# Patient Record
Sex: Female | Born: 1960 | Hispanic: Yes | Smoking: Never smoker
Health system: Southern US, Community
[De-identification: ages and names within clinical notes are randomized; demographics above are authoritative.]

## PROBLEM LIST (undated history)

## (undated) DIAGNOSIS — G43909 Migraine, unspecified, not intractable, without status migrainosus: Secondary | ICD-10-CM

## (undated) DIAGNOSIS — E079 Disorder of thyroid, unspecified: Secondary | ICD-10-CM

## (undated) HISTORY — PX: APPENDECTOMY: SHX54

---

## 2013-12-13 ENCOUNTER — Encounter (HOSPITAL_COMMUNITY): Payer: Self-pay | Admitting: Emergency Medicine

## 2013-12-13 ENCOUNTER — Emergency Department (HOSPITAL_COMMUNITY): Payer: Self-pay

## 2013-12-13 ENCOUNTER — Emergency Department (HOSPITAL_COMMUNITY)
Admission: EM | Admit: 2013-12-13 | Discharge: 2013-12-13 | Disposition: A | Discharge status: Home / Self Care | Payer: Self-pay | Attending: Emergency Medicine | Admitting: Emergency Medicine

## 2013-12-13 DIAGNOSIS — K8020 Calculus of gallbladder without cholecystitis without obstruction: Secondary | ICD-10-CM | POA: Diagnosis present

## 2013-12-13 HISTORY — DX: Migraine, unspecified, not intractable, without status migrainosus: G43.909

## 2013-12-13 LAB — URINALYSIS, ROUTINE W REFLEX MICROSCOPIC
BILIRUBIN URINE: NEGATIVE
Glucose, UA: NEGATIVE mg/dL
KETONES UR: NEGATIVE mg/dL
Leukocytes, UA: NEGATIVE
NITRITE: NEGATIVE
Protein, ur: NEGATIVE mg/dL
Specific Gravity, Urine: 1.019 (ref 1.005–1.030)
UROBILINOGEN UA: 0.2 mg/dL (ref 0.0–1.0)
pH: 6 (ref 5.0–8.0)

## 2013-12-13 LAB — URINE MICROSCOPIC-ADD ON

## 2013-12-13 LAB — CBC WITH DIFFERENTIAL/PLATELET
Basophils Absolute: 0 10*3/uL (ref 0.0–0.1)
Basophils Relative: 0 % (ref 0–1)
EOS ABS: 0.1 10*3/uL (ref 0.0–0.7)
Eosinophils Relative: 1 % (ref 0–5)
HCT: 36.8 % (ref 36.0–46.0)
HEMOGLOBIN: 12.8 g/dL (ref 12.0–15.0)
LYMPHS PCT: 38 % (ref 12–46)
Lymphs Abs: 4 10*3/uL (ref 0.7–4.0)
MCH: 30.1 pg (ref 26.0–34.0)
MCHC: 34.8 g/dL (ref 30.0–36.0)
MCV: 86.6 fL (ref 78.0–100.0)
MONOS PCT: 7 % (ref 3–12)
Monocytes Absolute: 0.8 10*3/uL (ref 0.1–1.0)
NEUTROS PCT: 54 % (ref 43–77)
Neutro Abs: 5.8 10*3/uL (ref 1.7–7.7)
Platelets: 358 10*3/uL (ref 150–400)
RBC: 4.25 MIL/uL (ref 3.87–5.11)
RDW: 13.7 % (ref 11.5–15.5)
WBC: 10.8 10*3/uL — AB (ref 4.0–10.5)

## 2013-12-13 LAB — COMPREHENSIVE METABOLIC PANEL
ALK PHOS: 118 U/L — AB (ref 39–117)
ALT: 20 U/L (ref 0–35)
AST: 22 U/L (ref 0–37)
Albumin: 3.8 g/dL (ref 3.5–5.2)
BILIRUBIN TOTAL: 0.3 mg/dL (ref 0.3–1.2)
BUN: 14 mg/dL (ref 6–23)
CHLORIDE: 103 meq/L (ref 96–112)
CO2: 26 meq/L (ref 19–32)
Calcium: 9.8 mg/dL (ref 8.4–10.5)
Creatinine, Ser: 0.64 mg/dL (ref 0.50–1.10)
GLUCOSE: 103 mg/dL — AB (ref 70–99)
POTASSIUM: 4.1 meq/L (ref 3.7–5.3)
Sodium: 141 mEq/L (ref 137–147)
TOTAL PROTEIN: 7.9 g/dL (ref 6.0–8.3)

## 2013-12-13 LAB — LIPASE, BLOOD: LIPASE: 51 U/L (ref 11–59)

## 2013-12-13 MED ORDER — SODIUM CHLORIDE 0.9 % IV BOLUS (SEPSIS)
1000.0000 mL | INTRAVENOUS | Status: AC
  Administered 2013-12-13: 1000 mL via INTRAVENOUS

## 2013-12-13 MED ORDER — HYDROMORPHONE HCL PF 1 MG/ML IJ SOLN
1.0000 mg | INTRAMUSCULAR | Status: AC
  Administered 2013-12-13: 1 mg via INTRAVENOUS
  Filled 2013-12-13: qty 1

## 2013-12-13 MED ORDER — OXYCODONE-ACETAMINOPHEN 5-325 MG PO TABS: 1.0000 | ORAL_TABLET | Freq: Four times a day (QID) | ORAL | Status: DC | PRN

## 2013-12-13 MED ORDER — ONDANSETRON 4 MG PO TBDP: ORAL_TABLET | ORAL | Status: DC

## 2013-12-13 MED ORDER — ONDANSETRON HCL 4 MG/2ML IJ SOLN
4.0000 mg | Freq: Once | INTRAMUSCULAR | Status: AC
  Administered 2013-12-13: 4 mg via INTRAVENOUS
  Filled 2013-12-13: qty 2

## 2013-12-13 MED ORDER — OXYCODONE-ACETAMINOPHEN 5-325 MG PO TABS
1.0000 | ORAL_TABLET | Freq: Once | ORAL | Status: AC
  Administered 2013-12-13: 1 via ORAL
  Filled 2013-12-13: qty 1

## 2013-12-13 MED ORDER — HYDROMORPHONE HCL PF 1 MG/ML IJ SOLN
0.5000 mg | Freq: Once | INTRAMUSCULAR | Status: AC
  Administered 2013-12-13: 0.5 mg via INTRAVENOUS
  Filled 2013-12-13: qty 1

## 2013-12-13 MED ORDER — ONDANSETRON 4 MG PO TBDP
4.0000 mg | ORAL_TABLET | Freq: Once | ORAL | Status: AC
  Administered 2013-12-13: 4 mg via ORAL
  Filled 2013-12-13: qty 1

## 2013-12-13 NOTE — ED Notes (Signed)
Pt begin vomiting at discharge. Notified EDP. Gave pt Zofran per order. Doctor stated to keep pt. Informed pt to stay and we will follow up in 30 minutes.

## 2013-12-13 NOTE — Discharge Instructions (Signed)
Colelitiasis (Cholelithiasis) La colelitiasis (tambin llamada clculos en la vescula) es una enfermedad en la que se forman piedras en la vescula. La vescula es un rgano que almacena la bilis que se forma en el hgado y que ayuda a Engineer, agricultural. Los clculos comienzan como pequeos cristales y lentamente se transforman en piedras. El dolor en la vescula ocurre cuando se producen espasmos y los clculos obstruyen el conducto. El dolor tambin se produce cuando una piedra sale por el conducto.  FACTORES DE RIESGO  Ser mujer.   Tener embarazos mltiples. Algunas veces los mdicos aconsejan extirpar los clculos biliares antes de futuros embarazos.   Ser obeso.  Dietas que incluyan comidas fritas y grasas.   Ser mayor de 56 aos y el aumento de la edad.   El uso prolongado de medicamentos que contengan hormonas femeninas.   Tener diabetes mellitus.   Prdida rpida de peso.   Historia familiar de clculos (herencia).  SNTOMAS  Nuseas.   Vmitos.  Dolor abdominal.   Piel amarilla (ictericia)   Dolor sbito. Puede persistir desde algunos minutos hasta algunas horas.  Grant Ruts.   Sensibilidad al tacto. En algunos casos, cuando los clculos biliares no se mueven hacia el conducto biliar, las personas no sienten dolor ni presentan sntomas. Estos se denominan clculos "silenciosos".  TRATAMIENTO Los clculos silenciosos no requieren TEFL teacher. En los [employer], podr ser [place]. Las opciones de tratamiento son:  Kandis Ban para extirpar la vescula. Es el tratamiento ms frecuente.  Medicamentos. No siempre dan resultado y pueden demorar entre 6 y 12 meses o ms en Scientist, water quality.  Tratamiento con ondas de choque (litotricia biliar extracorporal). En este tratamiento, una mquina de ultrasonido enva ondas de choque a la vescula para destruir los clculos en pequeos fragmentos que luego podrn pasar a los intestinos o ser disueltas  con medicamentos. INSTRUCCIONES PARA EL CUIDADO EN EL HOGAR   Slo tome medicamentos de venta libre o recetados para Primary school teacher, Environmental health practitioner o bajar la fiebre, segn las indicaciones de su mdico.   Siga una dieta baja en grasas hasta que su mdico lo vea nuevamente. Las grasas hacen que la vescula se Technical sales engineer, lo que puede Engineer, agricultural.   Concurra a las consultas de control con su mdico segn las indicaciones. Los ataques casi siempre son recurrentes y generalmente habr que someterse a una ciruga como [place].  SOLICITE ATENCIN MDICA DE INMEDIATO SI:   El dolor aumenta y no puede controlarlo con los medicamentos.   Tiene fiebre o sntomas persistentes durante ms de 2 - 3 das.   Tiene fiebre y los sntomas empeoran repentinamente.   Tiene nuseas o vmitos persistentes.  ASEGRESE DE QUE:   Comprende estas instrucciones.  Controlar su afeccin.  Recibir ayuda de inmediato si no mejora o si empeora. Document Released: 07/12/2006 Document Revised: 05/28/2013 [facility] Patient Information 2014 [place], [place]. Clico biliar (Biliary Colic)  El clico biliar es un dolor continuo o irregular en la zona superior del abdomen. Generalmente se ubica debajo de la zona derecha de la caja torcica. Aparece cuando los clculos biliares interfieren con el flujo normal de la bilis que proviene de la vescula. La bilis es un lquido que interviene en la digestin de las [place]. Se produce en el hgado y se almacena en la vescula. Al comer, La bilis pasa desde la vescula, a travs del conducto cstico y el conducto biliar comn al intestino delgado. All se mezcla con la comida parcialmente digerida.  Si un clculo obstruye alguno de esos conductos, se detiene el flujo normal de bilis. Las clulas del conducto biliar se contraen con fuerza para mover el clculo. Esto causa el dolor del clico biliar.  SNTOMAS  El paciente se queja de dolor en la zona superior  del abdomen. El dolor puede ser:  En el centro de la zona superior del abdomen, justo por debajo del esternn.  En la zona superior derecha del abdomen, donde se encuentra la vescula biliar y el hgado.  Se expande hacia la espalda, hacia el omplato derecho.  Nuseas y vmitos  El dolor comienza generalmente despus de comer.  El clico biliar aparece como una demanda de bilis por parte del sistema digestivo. La demanda de bilis es mayor luego de ingerir alimentos ricos en grasas. Los sntomas tambin Armed forces operational officerpueden aparecer en personas que han estado ayunando e ingieren abruptamente una comida abundante. La mayora de los episodios de clico biliar mejoran luego de 1 a 5 horas. Despus que se alivia el dolor ms intenso, podr seguir sintiendo un dolor moderado en el abdomen durante un lapso de 24 horas. DIAGNSTICO Luego de escuchar la descripcin de los sntomas, el mdico realizar un examen fsico. Deber prestar atencin a la zona superior del abdomen. Esta es la zona en la que se encuentra el hgado y la vescula biliar. El mdico podr observar los clculos a travs de una ecografa. Tambin le realizaran un escaneo especializado de la vescula biliar. Le indicarn anlisis de [place], especialmente si tiene fiebre o el dolor persiste. PREVENCIN El clico biliar puede evitarse controlando los factores de riesgo que favorecen los clculos. Algunos de Toys ''R'' Usestos factores de riesgo como la herencia, el aumento de la edad y Firefighterel embarazo son aspectos normales de la vida. La obesidad y [place] dieta rica en grasas son factores de riesgo que usted puede modificar a travs de cambios hacia un estilo de vida saludable. Las mujeres que atraviesan la menopausia y que reciben terapia de reemplazo hormonal (estrgenos) tambin tienen ms riesgo de Environmental education officerdesarrollar clicos biliares. TRATAMIENTO  Le prescribirn analgsicos.  Le indicarn una dieta sin grasas.  Si el primer episodio es intenso, o si los clicos aparecen  nuevamente, generalmente se indica la ciruga para extirpar la vescula (colecistectoma). Este procedimiento puede realizarse a travs de pequeas incisiones utilizando un instrumento denominado laparoscopio. Muchas veces se requiere una breve estada en el hospital. Algunas personas reciben el alta el mismo da. Es el tratamiento ms ampliamente utilizado en personas que sufren dolor por clculos biliares. Es efectivo y seguro, no tiene complicaciones en ms del 90% de los [place].  Si la ciruga no puede llevarse a cabo, podrn utilizarse medicamentos para Valero Energydisolver los clculos. Esta medicacin es cara y puede demorar meses o aos hasta que Beattytenga efecto. Slo podr disolver clculos pequeos.  En algunos casos raros, se combinan estos medicamentos con un procedimiento denominado litotricia por ondas de choque. Este procedimiento [place] Islandsutiliza ondas de choque cuidadosamente dirigidas a romper los clculos. En muchas personas tratadas con este procedimiento, los clculos vuelven a formarse luego de Freescale Semiconductoralgunos aos. PRONSTICO Si los clculos obstruyen el conducto cstico o conducto biliar comn, usted tiene el riesgo de sufrir episodios repetidos de clicos biliares. Tambin existe un 25% de probabilidades de desarrollar una infeccin de la vescula biliar (colecistitisaguda) o alguna otra complicacin en los siguientes 10 a 20 aos. Si ha sido sometido a [place]inauna ciruga, progrmela para el momento en que sea conveniente para usted, y para cuando no se encuentre  enfermo. INSTRUCCIONES PARA EL CUIDADO DOMICILIARIO  Beba gran cantidad de lquidos claros.  Evite las comidas con mucha grasa o fritas, o cualquier alimento que empeore su dolor.  Tome los medicamentos como se le indic. SOLICITE ATENCIN MDICA SI:  Le sube la temperatura a ms de 100.5 F (38.1 C).  El dolor empeora con el [place].  Siente nuseas y [employer] comer y beber.  Tiene vmitos. SOLICITE ATENCIN MDICA DE INMEDIATO SI:  Siente  dolor continuo e intenso en el abdomen, que no se alivia con medicamentos.  Siente nuseas y vmitos que no mejoran con medicamentos.  Tiene sntomas de clico biliar y comienza a Lodema Pilot y escalofros. Esto puede ser un indicio de que ha desarrollado colecistitis. Comunquese con su mdico inmediatamente.  Su piel o la parte blanca del ojo se vuelven amarillas (ictericia). Document Released: 01/02/2008 Document Revised: 12/18/2011 [facility] Patient Information 2014 [place], [place].

## 2013-12-13 NOTE — ED Notes (Signed)
Patient transported to Ultrasound 

## 2013-12-13 NOTE — ED Provider Notes (Signed)
CSN: [id number]     Arrival date & time 12/13/13  [id number] History   First MD Initiated Contact with Patient 12/13/13 [phone]     Chief Complaint  Patient presents with  . Abdominal Pain     (Consider location/radiation/quality/duration/timing/severity/associated sxs/prior Treatment) Patient is a 53 y.o. female presenting with abdominal pain. The history is provided by the patient.  Abdominal Pain Pain location:  RUQ Pain quality: sharp   Pain radiates to:  Does not radiate Pain severity:  Moderate Onset quality:  Gradual Duration:  3 days Timing:  Constant Progression:  Worsening Chronicity:  Recurrent Context comment:  Worse w/ eating Relieved by:  Nothing Worsened by:  Nothing tried Ineffective treatments:  None tried Associated symptoms: nausea and vomiting (one episode)   Associated symptoms: no chest pain, no cough, no diarrhea, no dysuria, no fatigue, no fever, no hematuria and no shortness of breath     No past medical history on file. No past surgical history on file. No family history on file. History  Substance Use Topics  . Smoking status: Not on file  . Smokeless tobacco: Not on file  . Alcohol Use: Not on file   OB History   No data available     Review of Systems  Constitutional: Negative for fever and fatigue.  HENT: Negative for congestion and drooling.   Eyes: Negative for pain.  Respiratory: Negative for cough and shortness of breath.   Cardiovascular: Negative for chest pain.  Gastrointestinal: Positive for nausea, vomiting (one episode) and abdominal pain. Negative for diarrhea.  Genitourinary: Negative for dysuria and hematuria.  Musculoskeletal: Negative for back pain, gait problem and neck pain.  Skin: Negative for color change.  Neurological: Negative for dizziness and headaches.  Hematological: Negative for adenopathy.  Psychiatric/Behavioral: Negative for behavioral problems.  All other systems reviewed and are negative.      Allergies   Review of patient's allergies indicates not on file.  Home Medications  No current outpatient prescriptions on file. There were no vitals taken for this visit. Physical Exam  Nursing note and vitals reviewed. Constitutional: She is oriented to person, place, and time. She appears well-developed and well-nourished.  HENT:  Head: Normocephalic.  Mouth/Throat: Oropharynx is clear and moist. No oropharyngeal exudate.  Eyes: Conjunctivae and EOM are normal. Pupils are equal, round, and reactive to light.  Neck: Normal range of motion. Neck supple.  Cardiovascular: Normal rate, regular rhythm, normal heart sounds and intact distal pulses.  Exam reveals no gallop and no friction rub.   No murmur heard. Pulmonary/Chest: Effort normal and breath sounds normal. No respiratory distress. She has no wheezes.  Abdominal: Soft. Bowel sounds are normal. There is tenderness (moderate tenderness to palpation of the right upper quadrantand very mild tenderness to palpation of the right lower quadrant.). There is no rebound and no guarding.  Musculoskeletal: Normal range of motion. She exhibits no edema and no tenderness.  Neurological: She is alert and oriented to person, place, and time.  Skin: Skin is warm and dry.  Psychiatric: She has a normal mood and affect. Her behavior is normal.    ED Course  Procedures (including critical care time) Labs Review Labs Reviewed  CBC WITH DIFFERENTIAL - Abnormal; Notable for the following:    WBC 10.8 (*)    All other components within normal limits  COMPREHENSIVE METABOLIC PANEL - Abnormal; Notable for the following:    Glucose, Bld 103 (*)    Alkaline Phosphatase 118 (*)  All other components within normal limits  URINALYSIS, ROUTINE W REFLEX MICROSCOPIC - Abnormal; Notable for the following:    Hgb urine dipstick SMALL (*)    All other components within normal limits  LIPASE, BLOOD  URINE MICROSCOPIC-ADD ON   Imaging Review US Abdomen Limited  Ruq  12/13/2013   CLINICAL DATA:  Right upper quadrant pain.  EXAM: US ABDOMEN LIMITED - RIGHT UPPER QUADRANT  COMPARISON:  CT 09/15/2012  FINDINGS: Gallbladder:  Echogenic structures in the gallbladder with posterior acoustic shadowing. Findings are consistent with cholelithiasis. Largest stone measures 2.1 cm. No evidence for gallbladder wall thickening. The patient did not have a sonographic Murphy's sign.  Common bile duct:  Diameter: 0.5 cm  Liver:  Liver parenchyma is slightly heterogeneous without a focal abnormality. No evidence for intrahepatic biliary dilatation. The main portal vein is patent.  IMPRESSION: Cholelithiasis. Largest stone measures 2.1 cm. No evidence for biliary dilatation.   Electronically Signed   By: Richarda Overlie M.D.   On: 12/13/2013 11:08     EKG Interpretation None      MDM   Final diagnoses:  Cholelithiasis    10:18 AM 53 y.o. female who presents with right upper quadrant abdominal pain. She has had intermittent pain for the last 6-8 months which is worse with eating. She notes constant pain over the last 2-3 days. She denies any fevers, she's had one episode of emesis. She denies any diarrhea. She states that she occasionally sees some bright red blood in her stool about once a month when she gets constipated. She also notes urinary frequency and dysuria. Suspicious for gallbladder pathology, will get screening labwork, or upper quadrant ultrasound, and pain control.  Pt reports hx of appendectomy.   1:27 PM: [place] c/w gallstones, no biliary dilatation or evid of cholecystitis. Mildly elev wbc. Pt now pain free and tolerating po. Will rec outpt f/u w/ [facility].  I have discussed the diagnosis/risks/treatment options with the patient and family and believe the pt to be eligible for discharge home to follow-up with [facility], call for appt next week. We also discussed returning to the ED immediately if new or worsening sx occur. We discussed the sx which are most concerning (e.g.,  worsening pain, fever, inc vomiting) that necessitate immediate return. Medications administered to the patient during their visit and any new prescriptions provided to the patient are listed below.  Medications given during this visit Medications  HYDROmorphone (DILAUDID) injection 1 mg (1 mg Intravenous Given 12/13/13 1041)  sodium chloride 0.9 % bolus 1,000 mL (0 mLs Intravenous Stopped 12/13/13 1205)  ondansetron (ZOFRAN) injection 4 mg (4 mg Intravenous Given 12/13/13 1040)  oxyCODONE-acetaminophen (PERCOCET/ROXICET) 5-325 MG per tablet 1 tablet (1 tablet Oral Given 12/13/13 1204)  HYDROmorphone (DILAUDID) injection 0.5 mg (0.5 mg Intravenous Given 12/13/13 1157)    New Prescriptions   OXYCODONE-ACETAMINOPHEN (PERCOCET) 5-325 MG PER TABLET    Take 1 tablet by mouth every 6 (six) hours as needed for moderate pain.       Junius Argyle, MD 12/13/13 1329

## 2013-12-13 NOTE — ED Notes (Signed)
Pt from home with c/o of right side abdominal pain intermittently x 6-8 months.  The last several days the pain has increased and swelling is associated with the right side with a burning and stabbing sensation.  Pt reports vomiting x 1 yesterday with constant nausea, urinary frequency and painful urination.  Pain increases after eating.  Pt in NAD, A&O.

## 2013-12-17 ENCOUNTER — Encounter (HOSPITAL_COMMUNITY): Payer: Self-pay | Admitting: Emergency Medicine

## 2013-12-17 ENCOUNTER — Inpatient Hospital Stay (HOSPITAL_COMMUNITY)
Admission: EM | Admit: 2013-12-17 | Discharge: 2013-12-19 | DRG: 419 | Disposition: A | Discharge status: Home / Self Care | Payer: Self-pay | Attending: Surgery | Admitting: Surgery

## 2013-12-17 ENCOUNTER — Ambulatory Visit: Payer: Self-pay | Admitting: Family Medicine

## 2013-12-17 VITALS — BP 110/70 | HR 61 | Temp 98.0°F | Resp 16 | Ht <= 58 in | Wt 144.0 lb

## 2013-12-17 DIAGNOSIS — K8000 Calculus of gallbladder with acute cholecystitis without obstruction: Secondary | ICD-10-CM

## 2013-12-17 DIAGNOSIS — R109 Unspecified abdominal pain: Secondary | ICD-10-CM

## 2013-12-17 DIAGNOSIS — K8020 Calculus of gallbladder without cholecystitis without obstruction: Secondary | ICD-10-CM

## 2013-12-17 DIAGNOSIS — K810 Acute cholecystitis: Secondary | ICD-10-CM | POA: Diagnosis present

## 2013-12-17 LAB — CBC WITH DIFFERENTIAL/PLATELET
Basophils Absolute: 0 10*3/uL (ref 0.0–0.1)
Basophils Relative: 0 % (ref 0–1)
EOS PCT: 1 % (ref 0–5)
Eosinophils Absolute: 0.1 10*3/uL (ref 0.0–0.7)
HEMATOCRIT: 36.3 % (ref 36.0–46.0)
HEMOGLOBIN: 12.2 g/dL (ref 12.0–15.0)
LYMPHS PCT: 40 % (ref 12–46)
Lymphs Abs: 3.6 10*3/uL (ref 0.7–4.0)
MCH: 29.3 pg (ref 26.0–34.0)
MCHC: 33.6 g/dL (ref 30.0–36.0)
MCV: 87.3 fL (ref 78.0–100.0)
MONO ABS: 0.5 10*3/uL (ref 0.1–1.0)
MONOS PCT: 6 % (ref 3–12)
NEUTROS ABS: 4.8 10*3/uL (ref 1.7–7.7)
Neutrophils Relative %: 53 % (ref 43–77)
Platelets: 401 10*3/uL — ABNORMAL HIGH (ref 150–400)
RBC: 4.16 MIL/uL (ref 3.87–5.11)
RDW: 13.6 % (ref 11.5–15.5)
WBC: 9.1 10*3/uL (ref 4.0–10.5)

## 2013-12-17 LAB — COMPREHENSIVE METABOLIC PANEL
ALT: 46 U/L — ABNORMAL HIGH (ref 0–35)
AST: 41 U/L — ABNORMAL HIGH (ref 0–37)
Albumin: 3.5 g/dL (ref 3.5–5.2)
Alkaline Phosphatase: 164 U/L — ABNORMAL HIGH (ref 39–117)
BILIRUBIN TOTAL: 0.3 mg/dL (ref 0.3–1.2)
BUN: 12 mg/dL (ref 6–23)
CO2: 29 meq/L (ref 19–32)
CREATININE: 0.62 mg/dL (ref 0.50–1.10)
Calcium: 9.9 mg/dL (ref 8.4–10.5)
Chloride: 101 mEq/L (ref 96–112)
GFR calc Af Amer: >90 mL/min (ref >90)
GFR calc non Af Amer: >90 mL/min (ref >90)
Glucose, Bld: 100 mg/dL — ABNORMAL HIGH (ref 70–99)
Potassium: 4.1 mEq/L (ref 3.7–5.3)
Sodium: 141 mEq/L (ref 137–147)
Total Protein: 7.7 g/dL (ref 6.0–8.3)

## 2013-12-17 LAB — POCT UA - MICROSCOPIC ONLY
Bacteria, U Microscopic: NEGATIVE
Casts, Ur, LPF, POC: NEGATIVE
Crystals, Ur, HPF, POC: NEGATIVE
Mucus, UA: NEGATIVE
Yeast, UA: NEGATIVE

## 2013-12-17 LAB — POCT URINALYSIS DIPSTICK
Bilirubin, UA: NEGATIVE
Glucose, UA: NEGATIVE
Ketones, UA: NEGATIVE
Leukocytes, UA: NEGATIVE
Nitrite, UA: NEGATIVE
Protein, UA: NEGATIVE
Spec Grav, UA: 1.02
Urobilinogen, UA: 2
pH, UA: 8

## 2013-12-17 LAB — LIPASE, BLOOD: Lipase: 44 U/L (ref 11–59)

## 2013-12-17 MED ORDER — SODIUM CHLORIDE 0.9 % IV BOLUS (SEPSIS)
1000.0000 mL | Freq: Once | INTRAVENOUS | Status: AC
  Administered 2013-12-17: 1000 mL via INTRAVENOUS

## 2013-12-17 MED ORDER — ONDANSETRON HCL 4 MG/2ML IJ SOLN
4.0000 mg | Freq: Once | INTRAMUSCULAR | Status: AC
  Administered 2013-12-17: 4 mg via INTRAVENOUS
  Filled 2013-12-17: qty 2

## 2013-12-17 MED ORDER — HYDROMORPHONE HCL PF 1 MG/ML IJ SOLN
1.0000 mg | Freq: Once | INTRAMUSCULAR | Status: AC
  Administered 2013-12-17: 1 mg via INTRAVENOUS
  Filled 2013-12-17: qty 1

## 2013-12-17 NOTE — Patient Instructions (Addendum)
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 She has had vomiting and nausea, cannot eat.  She was given pain medicine and told to make an appointment here for a check-up.  Pain is now 10/10 and intolerable.  Objective: Clearly having abdominal distress: Restless and groaning  HEENT: No icterus or significant abnormality  Chest: Clear  Heart: reg, no murmur  Abdomen: soft with sharp pain on shallow palpation of RUQ, guarding and rebound to RUQ as well  Ext: Able to walk bent over, no edema.  CLINICAL DATA: Right upper quadrant pain.  EXAM:  US ABDOMEN LIMITED - RIGHT UPPER QUADRANT  COMPARISON: CT 09/15/2012  FINDINGS:  Gallbladder:  Echogenic structures in the gallbladder with posterior acoustic  shadowing. Findings are consistent with cholelithiasis. Largest  stone measures 2.1 cm. No evidence for gallbladder wall thickening.  The patient did not have a sonographic Murphy's sign.  Common bile duct:  Diameter: 0.5 cm  Liver:  Liver parenchyma is slightly heterogeneous without a focal  abnormality. No evidence for intrahepatic biliary dilatation. The  main portal vein is patent.  IMPRESSION:  Cholelithiasis. Largest stone measures 2.1 cm. No evidence for  biliary dilatation.  Electronically Signed  By: Richarda Overlie M.D.  On: 12/13/2013 11:08       Ref Range  4d ago     WBC  4.0 - 10.5 K/uL  10.8 (H)      RBC  3.87 - 5.11 MIL/uL  4.25       Hemoglobin  12.0 - 15.0 g/dL  16.1       HCT  09.6 - 46.0 %  36.8       MCV  78.0 - 100.0 fL  86.6       MCH  26.0 - 34.0 pg  30.1       MCHC  30.0 - 36.0 g/dL  04.5       RDW  40.9 - 15.5 %  13.7       Platelets  150 - 400 K/uL  358       Neutrophils Relative %  43 - 77 %  54       Neutro Abs  1.7 - 7.7 K/uL  5.8       Lymphocytes Relative  12 - 46 %  38       Lymphs Abs  0.7 - 4.0 K/uL  4.0        Monocytes Relative  3 - 12 %  7       Monocytes Absolute  0.1 - 1.0 K/uL  0.8       Eosinophils Relative  0 - 5 %  1       Eosinophils Absolute  0.0 - 0.7 K/uL  0.1       Basophils Relative  0 - 1 %  0       Basophils Absolute  0.0 - 0.1 K/uL  0.0       Assessment: Acute cholelithiasis, in need of urgent Surgical care.  Plan: Return to ED for appropriate surgical evaluation.  Janet Sidle, MD    Colecistitis  (Cholecystitis)  La colecistitis es la inflamacin de la vescula biliar. Generalmente la causa es la formacin de clculos biliares o sedimentos (colelitiasis)) en la vescula. La vescula almacena un lquido que ayuda a  digerir las grasas (bilis). La colecistitis es una enfermedad grave y requiere tratamiento inmediato.  CAUSAS   Clculos biliares. Los clculos biliares pueden obstruir el conducto que conduce a la vescula biliar, causando la acumulacin de bilis. Cuando la bilis se acumula, la vescula biliar se inflama.  Problemas en el conducto biliar, como la obstruccin por cicatrizacin o torsin.  Tumores. Los tumores pueden impedir que la bilis salga de la vescula adecuadamente, causando la acumulacin de la misma. Cuando la bilis se acumula, la vescula se inflama. SNTOMAS   Nuseas  Vmitos.  Dolor abdominal, especialmente en la zona superior derecha del abdomen.  Sensibilidad o hinchazn abdominal.  Sudoracin.  Escalofros.  Grant RutsFiebre.  Color amarillo de la piel y en la zona blanca del ojo (ictericia). DIAGNSTICO  Su mdico puede indicar exmenes de sangre para Engineer, manufacturingdetectar una infeccin o problemas en la vescula biliar. Tambin puede ordenar pruebas de diagnstico por imgenes, como ecografas o tomografa computada (CT). Otras pruebas pueden incluir un estudio de gammagrafa hepatobiliar con cido iminodiactico (HIDA). Esta exploracin permite a su mdico ver el paso de la bilis desde el hgado hasta la vescula biliar y el intestino delgado.   TRATAMIENTO  La hospitalizacin suele ser necesaria para disminuir la inflamacin de la vescula biliar. Posiblemente le indiquen que no coma ni beba nada (ayuno) durante cierto perodo de Leonatiempo. Podrn indicarle un medicamento para calmar el dolor o un antibitico para tratar la infeccin. Puede ser necesario realizar Neomia Dearuna ciruga para extirpar la vescula biliar (colecistectoma) cuando la inflamacin haya disminudo. Podra necesitar de inmediato una ciruga si aparecen complicaciones como la muerte del tejido de la vescula biliar (gangrena) o la ruptura (perforacin)) de la vescula biliar.  INSTRUCCIONES PARA EL CUIDADO EN EL HOGAR  El cuidado en el hogar depender del tipo de Tomas de Castrotratamiento. En general:   Si le han recetado antibiticos, tmelos segn las indicaciones. Tmelos todos, aunque se sienta mejor.  Solo tome medicamentos de venta libre o recetados para Chief Technology Officerel dolor, Dentistmalestar o [place]fiebre, segn las indicaciones del mdico.  Siga una dieta baja en grasas hasta que vuelva a ver al mdico nuevamente.  Cumpla con todas las visitas de control, segn le indique su mdico. SOLICITE ATENCIN MDICA DE INMEDIATO SI:   El dolor aumenta y no puede controlarlo con los medicamentos.  El dolor se traslada hacia alguna otra zona del abdomen o hacia la espalda.  Tiene fiebre.  Tiene nuseas o vmitos. ASEGRESE DE QUE:   Comprende estas instrucciones.  Controlar su enfermedad.  Solicitar ayuda de inmediato si no mejora o si empeora. Document Released: 07/05/2005 Document Revised: 12/18/2011 [facility] Patient Information 2014 [place], [place].

## 2013-12-17 NOTE — ED Notes (Signed)
Pt in with family c/o abd pain with vomiting, seen recently for same and dx with gallstones, states they were referred to a surgeon but pain is unbearable and patient is unable to wait for surgery, here requesting to have gallbladder removed

## 2013-12-17 NOTE — ED Notes (Signed)
Nurse First Rounds : Nurse explained delay , wait time and process to pt. VSS / respirations unlabored / no distress.  

## 2013-12-17 NOTE — Progress Notes (Signed)
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  She has had vomiting and nausea, cannot eat.  She was given pain medicine and told to make an appointment here for a check-up.  Pain is now 10/10 and intolerable.  Objective:  Clearly having abdominal distress:  Restless and groaning HEENT:  No icterus or significant abnormality Chest:  Clear Heart: reg, no murmur Abdomen: soft with sharp pain on shallow palpation of RUQ, guarding and rebound to RUQ as well Ext:  Able to walk bent over, no edema.  CLINICAL DATA: Right upper quadrant pain.  EXAM:  US ABDOMEN LIMITED - RIGHT UPPER QUADRANT  COMPARISON: CT 09/15/2012  FINDINGS:  Gallbladder:  Echogenic structures in the gallbladder with posterior acoustic  shadowing. Findings are consistent with cholelithiasis. Largest  stone measures 2.1 cm. No evidence for gallbladder wall thickening.  The patient did not have a sonographic Murphy's sign.  Common bile duct:  Diameter: 0.5 cm  Liver:  Liver parenchyma is slightly heterogeneous without a focal  abnormality. No evidence for intrahepatic biliary dilatation. The  main portal vein is patent.  IMPRESSION:  Cholelithiasis. Largest stone measures 2.1 cm. No evidence for  biliary dilatation.  Electronically Signed  By: Richarda OverlieAdam Henn M.D.  On: 12/13/2013 11:08    Ref Range 4d ago   WBC 4.0 - 10.5 K/uL 10.8 (H)   RBC 3.87 - 5.11 MIL/uL 4.25    Hemoglobin 12.0 - 15.0 g/dL 16.112.8    HCT 09.636.0 - 04.546.0 % 36.8    MCV 78.0 - 100.0 fL 86.6    MCH 26.0 - 34.0 pg 30.1    MCHC 30.0 - 36.0 g/dL 40.934.8    RDW 81.111.5 - 91.415.5 % 13.7    Platelets 150 - 400 K/uL 358    Neutrophils Relative % 43 - 77 % 54    Neutro Abs 1.7 - 7.7 K/uL 5.8    Lymphocytes Relative 12 - 46 % 38    Lymphs Abs 0.7 - 4.0 K/uL 4.0    Monocytes Relative 3 - 12 % 7    Monocytes Absolute 0.1 - 1.0 K/uL 0.8    Eosinophils Relative 0 - 5 % 1    Eosinophils Absolute 0.0 - 0.7 K/uL 0.1     Basophils Relative 0 - 1 % 0    Basophils Absolute 0.0 - 0.1 K/uL 0.0        Assessment:  Acute cholelithiasis, in need of urgent  Surgical care.  Plan:  Return to ED for appropriate surgical evaluation.  Elvina SidleKurt Jlee Harkless, MD

## 2013-12-17 NOTE — ED Notes (Signed)
Surgeon at bedside.  

## 2013-12-17 NOTE — ED Provider Notes (Signed)
CSN: [id number]     Arrival date & time 12/17/13  1450 History   First MD Initiated Contact with Patient 12/17/13 2136     Chief Complaint  Patient presents with  . Abdominal Pain   HPI Comments: 53 yo F hx of migraines, cholelithiasis, presents with CC abdominal pain.  Pt has been having intermittent pain for the last 6-8 months. She has been having severe RUQ abdominal pain for the last 8 days, constant, worsening in the last four days, with associated nausea, vomiting.  Pt diagnosed with cholelithiasis 3/7 by [place].  She was given Rx for percocet, zofran which she has been taking at home.  Pt states pain is intractable, with associated nausea, vomiting.  Pt states pain is there even without eating.  She has even attempted to double her pain medication, without benefit.  She states she has had also some subjective fever, chills, but has not taken her temperature, and denies any other symptoms.  Pt comes to ED today for further eval.    The history is provided by the patient and a relative. No language interpreter was used.    Past Medical History  Diagnosis Date  . Migraines    Past Surgical History  Procedure Laterality Date  . Appendectomy     History reviewed. No pertinent family history. History  Substance Use Topics  . Smoking status: Never Smoker   . Smokeless tobacco: Never Used  . Alcohol Use: No   OB History   Grav Para Term Preterm Abortions TAB SAB Ect Mult Living                 Review of Systems  Constitutional: Positive for fever and chills.  Respiratory: Negative for cough and shortness of breath.   Cardiovascular: Negative for chest pain, palpitations and leg swelling.  Gastrointestinal: Positive for nausea, vomiting and abdominal pain. Negative for diarrhea, constipation and abdominal distention.  Genitourinary: Negative for dysuria, hematuria, vaginal bleeding and vaginal discharge.  Musculoskeletal: Negative for myalgias.  Skin: Negative for rash.   Neurological: Negative for dizziness, weakness, light-headedness, numbness and headaches.  Hematological: Negative for adenopathy. Does not bruise/bleed easily.  All other systems reviewed and are negative.      Allergies  Review of patient's allergies indicates no known allergies.  Home Medications   Current Outpatient Rx  Name  Route  Sig  Dispense  Refill  . acetaminophen (TYLENOL) 500 MG tablet   Oral   Take 1,500 mg by mouth daily as needed for mild pain.         . Multiple Vitamins-Minerals (MULTIVITAMIN PO)   Oral   Take 1 tablet by mouth daily.         . ondansetron (ZOFRAN ODT) 4 MG disintegrating tablet      69m ODT q4 hours prn nausea/vomit   15 tablet   0   . oxyCODONE-acetaminophen (PERCOCET) 5-325 MG per tablet   Oral   Take 1 tablet by mouth every 6 (six) hours as needed for moderate pain.   25 tablet   0    BP 133/71  Pulse 62  Temp(Src) 98.9 F (37.2 C) (Oral)  Resp 14  SpO2 97%  LMP 10/15/2013 Physical Exam  Nursing note and vitals reviewed. Constitutional: She is oriented to person, place, and time. She appears well-developed and well-nourished.  HENT:  Head: Normocephalic and atraumatic.  Right Ear: External ear normal.  Left Ear: External ear normal.  Nose: Nose normal.  Mouth/Throat: Oropharynx is  clear and moist.  Eyes: Conjunctivae and EOM are normal. Pupils are equal, round, and reactive to light.  Neck: Normal range of motion. Neck supple.  Cardiovascular: Normal rate, regular rhythm, normal heart sounds and intact distal pulses.   Pulmonary/Chest: Effort normal and breath sounds normal. No respiratory distress. She has no wheezes. She has no rales. She exhibits no tenderness.  Abdominal: Soft. Bowel sounds are normal. She exhibits no distension and no mass. There is tenderness. There is no rebound and no guarding.  TTP RUQ, epigastric.  Soft, nondistended, no guarding, no rebound.    Musculoskeletal: Normal range of motion.   Neurological: She is alert and oriented to person, place, and time. She has normal reflexes.  Skin: Skin is warm and dry.    ED Course  Procedures (including critical care time) Labs Review Labs Reviewed  CBC WITH DIFFERENTIAL - Abnormal; Notable for the following:    Platelets 401 (*)    All other components within normal limits  COMPREHENSIVE METABOLIC PANEL - Abnormal; Notable for the following:    Glucose, Bld 100 (*)    AST 41 (*)    ALT 46 (*)    Alkaline Phosphatase 164 (*)    All other components within normal limits  LIPASE, BLOOD   Imaging Review No results found.   EKG Interpretation None      MDM   Final diagnoses:  None   53 yo F hx of migraines, cholelithiasis, presents with CC abdominal pain.  Filed Vitals:   12/17/13 2130  BP: 133/71  Pulse: 62  Temp:   Resp:    Physical exam as above.  VS WNL, pt afebrile.  Pt with RUQ, epigastric TTP, but abdomen is otherwise soft, nonperitonitic.  Pt rates pain 7/10 currently.  Pt given IVF, pain meds, with some improvement. Labs show some elevation in LFTs and alk phos since 3/7.  Bedside US shows.  Surgery consulted for symptomatic cholelithiasis, intractable pain, n/v.  Pt to be admitted to surgery service under Dr. Brantley Stage, with plan for cholecystecomy in next 24-48 hours.  Pt and family understand and agree with plan.  Pt's care discussed with Dr. Reather Converse.   Sinda Du, MD     Sinda Du, MD 12/18/13 [id number]

## 2013-12-18 ENCOUNTER — Encounter (HOSPITAL_COMMUNITY): Payer: Self-pay | Admitting: Surgery

## 2013-12-18 ENCOUNTER — Inpatient Hospital Stay (HOSPITAL_COMMUNITY): Payer: Self-pay | Admitting: Anesthesiology

## 2013-12-18 ENCOUNTER — Encounter (HOSPITAL_COMMUNITY): Admission: EM | Disposition: A | Discharge status: Home / Self Care | Payer: Self-pay | Source: Home / Self Care

## 2013-12-18 ENCOUNTER — Encounter (HOSPITAL_COMMUNITY): Payer: Self-pay | Admitting: Anesthesiology

## 2013-12-18 DIAGNOSIS — K810 Acute cholecystitis: Secondary | ICD-10-CM | POA: Diagnosis present

## 2013-12-18 DIAGNOSIS — K8010 Calculus of gallbladder with chronic cholecystitis without obstruction: Secondary | ICD-10-CM

## 2013-12-18 HISTORY — PX: CHOLECYSTECTOMY: SHX55

## 2013-12-18 LAB — COMPREHENSIVE METABOLIC PANEL
ALBUMIN: 3 g/dL — AB (ref 3.5–5.2)
ALK PHOS: 149 U/L — AB (ref 39–117)
ALT: 34 U/L (ref 0–35)
AST: 30 U/L (ref 0–37)
BILIRUBIN TOTAL: 0.2 mg/dL — AB (ref 0.3–1.2)
BUN: 12 mg/dL (ref 6–23)
CHLORIDE: 101 meq/L (ref 96–112)
CO2: 28 mEq/L (ref 19–32)
CREATININE: 0.61 mg/dL (ref 0.50–1.10)
Calcium: 9.2 mg/dL (ref 8.4–10.5)
GFR calc Af Amer: >90 mL/min (ref >90)
GFR calc non Af Amer: >90 mL/min (ref >90)
GLUCOSE: 143 mg/dL — AB (ref 70–99)
POTASSIUM: 4 meq/L (ref 3.7–5.3)
Sodium: 140 mEq/L (ref 137–147)
Total Protein: 6.8 g/dL (ref 6.0–8.3)

## 2013-12-18 LAB — CBC
HCT: 33.3 % — ABNORMAL LOW (ref 36.0–46.0)
Hemoglobin: 11.2 g/dL — ABNORMAL LOW (ref 12.0–15.0)
MCH: 29.6 pg (ref 26.0–34.0)
MCHC: 33.6 g/dL (ref 30.0–36.0)
MCV: 87.9 fL (ref 78.0–100.0)
Platelets: 386 10*3/uL (ref 150–400)
RBC: 3.79 MIL/uL — ABNORMAL LOW (ref 3.87–5.11)
RDW: 13.5 % (ref 11.5–15.5)
WBC: 7.7 10*3/uL (ref 4.0–10.5)

## 2013-12-18 LAB — SURGICAL PCR SCREEN
MRSA, PCR: NEGATIVE
Staphylococcus aureus: NEGATIVE

## 2013-12-18 SURGERY — LAPAROSCOPIC CHOLECYSTECTOMY WITH INTRAOPERATIVE CHOLANGIOGRAM
Anesthesia: General | Site: Abdomen

## 2013-12-18 MED ORDER — NEOSTIGMINE METHYLSULFATE 1 MG/ML IJ SOLN
INTRAMUSCULAR | Status: AC
  Filled 2013-12-18: qty 10

## 2013-12-18 MED ORDER — LIDOCAINE HCL (CARDIAC) 20 MG/ML IV SOLN
INTRAVENOUS | Status: DC | PRN
  Administered 2013-12-18: 80 mg via INTRAVENOUS

## 2013-12-18 MED ORDER — ARTIFICIAL TEARS OP OINT
TOPICAL_OINTMENT | OPHTHALMIC | Status: DC | PRN
  Administered 2013-12-18: 1 via OPHTHALMIC

## 2013-12-18 MED ORDER — KETOROLAC TROMETHAMINE 30 MG/ML IJ SOLN
INTRAMUSCULAR | Status: DC | PRN
  Administered 2013-12-18: 30 mg via INTRAVENOUS

## 2013-12-18 MED ORDER — OXYCODONE HCL 5 MG/5ML PO SOLN: 5.0000 mg | Freq: Once | ORAL | Status: DC | PRN

## 2013-12-18 MED ORDER — LACTATED RINGERS IV SOLN
INTRAVENOUS | Status: DC | PRN
  Administered 2013-12-18: 09:00:00 via INTRAVENOUS

## 2013-12-18 MED ORDER — BUPIVACAINE-EPINEPHRINE (PF) 0.25% -1:200000 IJ SOLN
INTRAMUSCULAR | Status: AC
  Filled 2013-12-18: qty 30

## 2013-12-18 MED ORDER — ONDANSETRON HCL 4 MG/2ML IJ SOLN
INTRAMUSCULAR | Status: DC | PRN
  Administered 2013-12-18: 4 mg via INTRAVENOUS

## 2013-12-18 MED ORDER — ROCURONIUM BROMIDE 100 MG/10ML IV SOLN
INTRAVENOUS | Status: DC | PRN
  Administered 2013-12-18: 40 mg via INTRAVENOUS

## 2013-12-18 MED ORDER — OXYCODONE HCL 5 MG PO TABS: 5.0000 mg | ORAL_TABLET | Freq: Once | ORAL | Status: DC | PRN

## 2013-12-18 MED ORDER — OXYCODONE-ACETAMINOPHEN 5-325 MG PO TABS
1.0000 | ORAL_TABLET | ORAL | Status: DC | PRN
  Administered 2013-12-19 (×2): 2 via ORAL
  Filled 2013-12-18 (×2): qty 2

## 2013-12-18 MED ORDER — MORPHINE SULFATE 2 MG/ML IJ SOLN
2.0000 mg | INTRAMUSCULAR | Status: DC | PRN
  Administered 2013-12-18 (×2): 2 mg via INTRAVENOUS
  Filled 2013-12-18 (×2): qty 1

## 2013-12-18 MED ORDER — HEMOSTATIC AGENTS (NO CHARGE) OPTIME
TOPICAL | Status: DC | PRN
  Administered 2013-12-18: 1 via TOPICAL

## 2013-12-18 MED ORDER — HYDROMORPHONE HCL PF 1 MG/ML IJ SOLN
1.0000 mg | INTRAMUSCULAR | Status: DC | PRN
  Administered 2013-12-18 (×2): 1 mg via INTRAVENOUS
  Filled 2013-12-18 (×2): qty 1

## 2013-12-18 MED ORDER — ONDANSETRON HCL 4 MG/2ML IJ SOLN
INTRAMUSCULAR | Status: AC
  Filled 2013-12-18: qty 2

## 2013-12-18 MED ORDER — KETOROLAC TROMETHAMINE 30 MG/ML IJ SOLN
INTRAMUSCULAR | Status: AC
  Filled 2013-12-18: qty 1

## 2013-12-18 MED ORDER — GLYCOPYRROLATE 0.2 MG/ML IJ SOLN
INTRAMUSCULAR | Status: DC | PRN
  Administered 2013-12-18: .68 mg via INTRAVENOUS

## 2013-12-18 MED ORDER — DEXTROSE IN LACTATED RINGERS 5 % IV SOLN
INTRAVENOUS | Status: DC
  Administered 2013-12-18: 100 mL/h via INTRAVENOUS

## 2013-12-18 MED ORDER — SODIUM CHLORIDE 0.9 % IR SOLN
Status: DC | PRN
  Administered 2013-12-18: 1000 mL

## 2013-12-18 MED ORDER — BUPIVACAINE-EPINEPHRINE 0.25% -1:200000 IJ SOLN
INTRAMUSCULAR | Status: DC | PRN
  Administered 2013-12-18: 30 mL

## 2013-12-18 MED ORDER — HYDROMORPHONE HCL PF 1 MG/ML IJ SOLN
0.2500 mg | INTRAMUSCULAR | Status: DC | PRN
  Administered 2013-12-18 (×2): 0.5 mg via INTRAVENOUS

## 2013-12-18 MED ORDER — ONDANSETRON HCL 4 MG/2ML IJ SOLN
4.0000 mg | Freq: Four times a day (QID) | INTRAMUSCULAR | Status: DC | PRN
  Administered 2013-12-18 (×2): 4 mg via INTRAVENOUS
  Filled 2013-12-18 (×2): qty 2

## 2013-12-18 MED ORDER — HYDROMORPHONE HCL PF 1 MG/ML IJ SOLN
INTRAMUSCULAR | Status: AC
  Filled 2013-12-18: qty 1

## 2013-12-18 MED ORDER — HYDROMORPHONE HCL PF 1 MG/ML IJ SOLN: 1.0000 mg | INTRAMUSCULAR | Status: DC | PRN

## 2013-12-18 MED ORDER — MIDAZOLAM HCL 5 MG/5ML IJ SOLN
INTRAMUSCULAR | Status: DC | PRN
  Administered 2013-12-18: 1.5 mg via INTRAVENOUS
  Administered 2013-12-18: 0.5 mg via INTRAVENOUS

## 2013-12-18 MED ORDER — ROCURONIUM BROMIDE 50 MG/5ML IV SOLN
INTRAVENOUS | Status: AC
  Filled 2013-12-18: qty 1

## 2013-12-18 MED ORDER — PROMETHAZINE HCL 25 MG/ML IJ SOLN
12.5000 mg | Freq: Four times a day (QID) | INTRAMUSCULAR | Status: DC | PRN
  Administered 2013-12-18: 12.5 mg via INTRAVENOUS
  Filled 2013-12-18: qty 1

## 2013-12-18 MED ORDER — LACTATED RINGERS IV SOLN
INTRAVENOUS | Status: DC
  Administered 2013-12-18 – 2013-12-19 (×2): via INTRAVENOUS

## 2013-12-18 MED ORDER — PROPOFOL 10 MG/ML IV BOLUS
INTRAVENOUS | Status: DC | PRN
  Administered 2013-12-18: 125 mg via INTRAVENOUS

## 2013-12-18 MED ORDER — MIDAZOLAM HCL 2 MG/2ML IJ SOLN
INTRAMUSCULAR | Status: AC
  Filled 2013-12-18: qty 2

## 2013-12-18 MED ORDER — 0.9 % SODIUM CHLORIDE (POUR BTL) OPTIME
TOPICAL | Status: DC | PRN
  Administered 2013-12-18: 1000 mL

## 2013-12-18 MED ORDER — CIPROFLOXACIN IN D5W 400 MG/200ML IV SOLN
400.0000 mg | INTRAVENOUS | Status: AC
  Administered 2013-12-18: 400 mg via INTRAVENOUS
  Filled 2013-12-18: qty 200

## 2013-12-18 MED ORDER — FENTANYL CITRATE 0.05 MG/ML IJ SOLN
INTRAMUSCULAR | Status: AC
  Filled 2013-12-18: qty 5

## 2013-12-18 MED ORDER — NEOSTIGMINE METHYLSULFATE 1 MG/ML IJ SOLN
INTRAMUSCULAR | Status: DC | PRN
  Administered 2013-12-18: 3.2 mg via INTRAVENOUS

## 2013-12-18 MED ORDER — PROPOFOL 10 MG/ML IV BOLUS
INTRAVENOUS | Status: AC
  Filled 2013-12-18: qty 20

## 2013-12-18 MED ORDER — FENTANYL CITRATE 0.05 MG/ML IJ SOLN
INTRAMUSCULAR | Status: DC | PRN
  Administered 2013-12-18 (×3): 50 ug via INTRAVENOUS

## 2013-12-18 MED ORDER — PROMETHAZINE HCL 25 MG/ML IJ SOLN: 6.2500 mg | INTRAMUSCULAR | Status: DC | PRN

## 2013-12-18 MED ORDER — GLYCOPYRROLATE 0.2 MG/ML IJ SOLN
INTRAMUSCULAR | Status: AC
  Filled 2013-12-18: qty 3

## 2013-12-18 SURGICAL SUPPLY — 46 items
APPLIER CLIP 5 13 M/L LIGAMAX5 (MISCELLANEOUS) ×3
BANDAGE ADHESIVE 1X3 (GAUZE/BANDAGES/DRESSINGS) ×3 IMPLANT
BENZOIN TINCTURE PRP APPL 2/3 (GAUZE/BANDAGES/DRESSINGS) ×3 IMPLANT
CANISTER SUCTION 2500CC (MISCELLANEOUS) ×3 IMPLANT
CHLORAPREP W/TINT 26ML (MISCELLANEOUS) ×3 IMPLANT
CLIP APPLIE 5 13 M/L LIGAMAX5 (MISCELLANEOUS) ×1 IMPLANT
CLOSURE WOUND 1/2 X4 (GAUZE/BANDAGES/DRESSINGS) ×1
COVER MAYO STAND STRL (DRAPES) IMPLANT
COVER SURGICAL LIGHT HANDLE (MISCELLANEOUS) ×3 IMPLANT
DECANTER SPIKE VIAL GLASS SM (MISCELLANEOUS) ×3 IMPLANT
DRAPE C-ARM 42X72 X-RAY (DRAPES) IMPLANT
DRAPE UTILITY 15X26 W/TAPE STR (DRAPE) ×6 IMPLANT
ELECT REM PT RETURN 9FT ADLT (ELECTROSURGICAL) ×3
ELECTRODE REM PT RTRN 9FT ADLT (ELECTROSURGICAL) ×1 IMPLANT
GLOVE BIO SURGEON STRL SZ7.5 (GLOVE) ×3 IMPLANT
GLOVE BIOGEL PI IND STRL 7.0 (GLOVE) ×2 IMPLANT
GLOVE BIOGEL PI IND STRL 7.5 (GLOVE) ×2 IMPLANT
GLOVE BIOGEL PI IND STRL 8 (GLOVE) ×2 IMPLANT
GLOVE BIOGEL PI INDICATOR 7.0 (GLOVE) ×4
GLOVE BIOGEL PI INDICATOR 7.5 (GLOVE) ×4
GLOVE BIOGEL PI INDICATOR 8 (GLOVE) ×4
GLOVE ECLIPSE 6.5 STRL STRAW (GLOVE) ×3 IMPLANT
GLOVE SURG SIGNA 7.5 PF LTX (GLOVE) ×3 IMPLANT
GLOVE SURG SS PI 7.0 STRL IVOR (GLOVE) ×3 IMPLANT
GOWN STRL REUS W/ TWL LRG LVL3 (GOWN DISPOSABLE) ×1 IMPLANT
GOWN STRL REUS W/ TWL XL LVL3 (GOWN DISPOSABLE) ×3 IMPLANT
GOWN STRL REUS W/TWL LRG LVL3 (GOWN DISPOSABLE) ×2
GOWN STRL REUS W/TWL XL LVL3 (GOWN DISPOSABLE) ×6
HEMOSTAT SNOW SURGICEL 2X4 (HEMOSTASIS) ×3 IMPLANT
KIT BASIN OR (CUSTOM PROCEDURE TRAY) ×3 IMPLANT
KIT ROOM TURNOVER OR (KITS) ×3 IMPLANT
NS IRRIG 1000ML POUR BTL (IV SOLUTION) ×3 IMPLANT
PAD ARMBOARD 7.5X6 YLW CONV (MISCELLANEOUS) ×3 IMPLANT
POUCH SPECIMEN RETRIEVAL 10MM (ENDOMECHANICALS) ×3 IMPLANT
SCISSORS LAP 5X35 DISP (ENDOMECHANICALS) ×3 IMPLANT
SET CHOLANGIOGRAPH 5 50 .035 (SET/KITS/TRAYS/PACK) IMPLANT
SET IRRIG TUBING LAPAROSCOPIC (IRRIGATION / IRRIGATOR) ×3 IMPLANT
SLEEVE ENDOPATH XCEL 5M (ENDOMECHANICALS) ×6 IMPLANT
SPECIMEN JAR SMALL (MISCELLANEOUS) ×3 IMPLANT
STRIP CLOSURE SKIN 1/2X4 (GAUZE/BANDAGES/DRESSINGS) ×2 IMPLANT
SUT MON AB 4-0 PC3 18 (SUTURE) ×3 IMPLANT
TOWEL OR 17X24 6PK STRL BLUE (TOWEL DISPOSABLE) IMPLANT
TOWEL OR 17X26 10 PK STRL BLUE (TOWEL DISPOSABLE) ×3 IMPLANT
TRAY LAPAROSCOPIC (CUSTOM PROCEDURE TRAY) ×3 IMPLANT
TROCAR XCEL BLUNT TIP 100MML (ENDOMECHANICALS) ×3 IMPLANT
TROCAR XCEL NON-BLD 5MMX100MML (ENDOMECHANICALS) ×3 IMPLANT

## 2013-12-18 NOTE — Progress Notes (Signed)
Patient ID: Janet Salazar, female   DOB: 30-Jul-1961, 53 y.o.   MRN: [id number]    Subjective: Pt still with pain and nausea.  Objective: Vital signs in last 24 hours: Temp:  [98 F (36.7 C)-98.9 F (37.2 C)] 98.2 F (36.8 C) (03/12 0530) Pulse Rate:  [61-73] 71 (03/12 0530) Resp:  [14-20] 20 (03/12 0530) BP: (110-140)/(54-76) 140/74 mmHg (03/12 0530) SpO2:  [94 %-100 %] 96 % (03/12 0530) Weight:  [144 lb (65.318 kg)-145 lb 4.5 oz (65.9 kg)] 145 lb 4.5 oz (65.9 kg) (03/12 0111)    Intake/Output from previous day:   Intake/Output this shift:    PE: Abd: soft, tender in RUQ, +BS Heart: regular Lungs: CTAB  Lab Results:   Recent Labs  12/17/13 1548  WBC 9.1  HGB 12.2  HCT 36.3  PLT 401*   BMET  Recent Labs  12/17/13 1548  NA 141  K 4.1  CL 101  CO2 29  GLUCOSE 100*  BUN 12  CREATININE 0.62  CALCIUM 9.9   PT/INR No results found for this basename: LABPROT, INR,  in the last 72 hours CMP     Component Value Date/Time   NA 141 12/17/2013 1548   K 4.1 12/17/2013 1548   CL 101 12/17/2013 1548   CO2 29 12/17/2013 1548   GLUCOSE 100* 12/17/2013 1548   BUN 12 12/17/2013 1548   CREATININE 0.62 12/17/2013 1548   CALCIUM 9.9 12/17/2013 1548   PROT 7.7 12/17/2013 1548   ALBUMIN 3.5 12/17/2013 1548   AST 41* 12/17/2013 1548   ALT 46* 12/17/2013 1548   ALKPHOS 164* 12/17/2013 1548   BILITOT 0.3 12/17/2013 1548   GFRNONAA >90 12/17/2013 1548   GFRAA >90 12/17/2013 1548   Lipase     Component Value Date/Time   LIPASE 44 12/17/2013 1548       Studies/Results: No results found.  Anti-infectives: Anti-infectives   Start     Dose/Rate Route Frequency Ordered Stop   12/18/13 0745  ciprofloxacin (CIPRO) IVPB 400 mg     400 mg 200 mL/hr over 60 Minutes Intravenous On call to O.R. 12/18/13 0741 12/19/13 0559       Assessment/Plan  1. Biliary colic  Plan: 1. I have discussed the surgery, risks, complications, and expected outcome with the patient and her daughter.   Her daughter translated for us as needed.  They both understand and the patient is agreeable to proceed 2. Surgery today.  cipro on call.   LOS: 1 day    Charles Niese E 12/18/2013, 7:41 AM Pager: [phone]

## 2013-12-18 NOTE — Anesthesia Procedure Notes (Signed)
Procedure Name: Intubation Date/Time: 12/18/2013 9:49 AM Performed by: Carmela RimaMARTINELLI, Katilynn Sinkler F Pre-anesthesia Checklist: Patient identified, Timeout performed, Emergency Drugs available, Suction available and Patient being monitored Patient Re-evaluated:Patient Re-evaluated prior to inductionOxygen Delivery Method: Circle system utilized Preoxygenation: Pre-oxygenation with 100% oxygen Intubation Type: IV induction Ventilation: Mask ventilation without difficulty Laryngoscope Size: Mac and 3 Grade View: Grade I Tube type: Oral Tube size: 7.0 mm Number of attempts: 1 (DL by Anselm JunglingJulie Renne, CRNA) Placement Confirmation: positive ETCO2,  ETT inserted through vocal cords under direct vision and breath sounds checked- equal and bilateral Secured at: 21 cm Tube secured with: Tape Dental Injury: Teeth and Oropharynx as per pre-operative assessment

## 2013-12-18 NOTE — Op Note (Signed)
Laparoscopic Cholecystectomy Procedure Note  Indications: This patient presents with symptomatic gallbladder disease and will undergo laparoscopic cholecystectomy.  Pre-operative Diagnosis: Calculus of gallbladder with acute cholecystitis, without mention of obstruction  Post-operative Diagnosis: Same  Surgeon: Abigail Miyamoto A   Assistants: 0  Anesthesia: General endotracheal anesthesia  ASA Class: 2  Procedure Details  The patient was seen again in the Holding Room. The risks, benefits, complications, treatment options, and expected outcomes were discussed with the patient. The possibilities of reaction to medication, pulmonary aspiration, perforation of viscus, bleeding, recurrent infection, finding a normal gallbladder, the need for additional procedures, failure to diagnose a condition, the possible need to convert to an open procedure, and creating a complication requiring transfusion or operation were discussed with the patient. The likelihood of improving the patient's symptoms with return to their baseline status is good.  The patient and/or family concurred with the proposed plan, giving informed consent. The site of surgery properly noted. The patient was taken to Operating Room, identified as Janet Salazar and the procedure verified as Laparoscopic Cholecystectomy with Intraoperative Cholangiogram. A Time Out was held and the above information confirmed.  Prior to the induction of general anesthesia, antibiotic prophylaxis was administered. General endotracheal anesthesia was then administered and tolerated well. After the induction, the abdomen was prepped with Chloraprep and draped in sterile fashion. The patient was positioned in the supine position.  Local anesthetic agent was injected into the skin near the umbilicus and an incision made. We dissected down to the abdominal fascia with blunt dissection.  The fascia was incised vertically and we entered the peritoneal cavity  bluntly.  A pursestring suture of 0-Vicryl was placed around the fascial opening.  The Hasson cannula was inserted and secured with the stay suture.  Pneumoperitoneum was then created with CO2 and tolerated well without any adverse changes in the patient's vital signs. An 11-mm port was placed in the subxiphoid position.  Two 5-mm ports were placed in the right upper quadrant. All skin incisions were infiltrated with a local anesthetic agent before making the incision and placing the trocars.   We positioned the patient in reverse Trendelenburg, tilted slightly to the patient's left.  The gallbladder was identified, the fundus grasped and retracted cephalad. Adhesions were lysed bluntly and with the electrocautery where indicated, taking care not to injure any adjacent organs or viscus. The infundibulum was grasped and retracted laterally, exposing the peritoneum overlying the triangle of Calot. This was then divided and exposed in a blunt fashion. The cystic duct was clearly identified and bluntly dissected circumferentially. A critical view of the cystic duct and cystic artery was obtained.  The cystic duct was then ligated with clips and divided. The cystic artery was, dissected free, ligated with clips and divided as well.   The gallbladder was dissected from the liver bed in retrograde fashion with the electrocautery. The gallbladder was removed and placed in an Endocatch sac. The liver bed was irrigated and inspected. Hemostasis was achieved with the electrocautery. Copious irrigation was utilized and was repeatedly aspirated until clear.  The gallbladder and Endocatch sac were then removed through the umbilical port site.  The pursestring suture was used to close the umbilical fascia.    We again inspected the right upper quadrant for hemostasis.  Pneumoperitoneum was released as we removed the trocars.  4-0 Monocryl was used to close the skin.   Benzoin, steri-strips, and clean dressings were applied.  The patient was then extubated and brought to the recovery room  in stable condition. Instrument, sponge, and needle counts were correct at closure and at the conclusion of the case.   Findings: Cholecystitis with Cholelithiasis There was a phlegmon of omentum stuck to the gallbladder, the abdominal wall, and the proximal transverse colon.  The colon proximal and distal to this area was normal.  Estimated Blood Loss: Minimal         Drains:          Specimens: Gallbladder           Complications: None; patient tolerated the procedure well.         Disposition: PACU - hemodynamically stable.         Condition: stable

## 2013-12-18 NOTE — H&P (Signed)
Janet Salazar is an 53 y.o. female.   Chief Complaint: ABDOMINAL PAIN RUQ HPI: PT HAS HISTORY OF GALLSTONES WITH INTERMITTENT RUQ PAIN FOR 6 MONTHS.  PAIN WORSENED OVER LAST DAY AND IS NOW CONSTANT. PT HAS N/V AS WELL.  PT SEEN IN ED 5 DAYS AGO WHERE U/S SHOWS MULTIPLE GALLSTONES.  PAIN NOT BETTER WITH MEDICATION.  LOCATION RUQ. [place].  WORSENED TODAY.  DAUGHTER AT BEDSIDE TO TRANSLATE.  Past Medical History  Diagnosis Date  . Migraines     Past Surgical History  Procedure Laterality Date  . Appendectomy      History reviewed. No pertinent family history. Social History:  reports that she has never smoked. She has never used smokeless tobacco. She reports that she does not drink alcohol or use illicit drugs.  Allergies: No Known Allergies   (Not in a hospital admission)  Results for orders placed during the hospital encounter of 12/17/13 (from the past 48 hour(s))  CBC WITH DIFFERENTIAL     Status: Abnormal   Collection Time    12/17/13  3:48 PM      Result Value Ref Range   WBC 9.1  4.0 - 10.5 K/uL   RBC 4.16  3.87 - 5.11 MIL/uL   Hemoglobin 12.2  12.0 - 15.0 g/dL   HCT 36.3  36.0 - 46.0 %   MCV 87.3  78.0 - 100.0 fL   MCH 29.3  26.0 - 34.0 pg   MCHC 33.6  30.0 - 36.0 g/dL   RDW 13.6  11.5 - 15.5 %   Platelets 401 (*) 150 - 400 K/uL   Neutrophils Relative % 53  43 - 77 %   Neutro Abs 4.8  1.7 - 7.7 K/uL   Lymphocytes Relative 40  12 - 46 %   Lymphs Abs 3.6  0.7 - 4.0 K/uL   Monocytes Relative 6  3 - 12 %   Monocytes Absolute 0.5  0.1 - 1.0 K/uL   Eosinophils Relative 1  0 - 5 %   Eosinophils Absolute 0.1  0.0 - 0.7 K/uL   Basophils Relative 0  0 - 1 %   Basophils Absolute 0.0  0.0 - 0.1 K/uL  COMPREHENSIVE METABOLIC PANEL     Status: Abnormal   Collection Time    12/17/13  3:48 PM      Result Value Ref Range   Sodium 141  137 - 147 mEq/L   Potassium 4.1  3.7 - 5.3 mEq/L   Chloride 101  96 - 112 mEq/L   CO2 29  19 - 32 mEq/L   Glucose, Bld 100 (*) 70 - 99 mg/dL    BUN 12  6 - 23 mg/dL   Creatinine, Ser 0.62  0.50 - 1.10 mg/dL   Calcium 9.9  8.4 - 10.5 mg/dL   Total Protein 7.7  6.0 - 8.3 g/dL   Albumin 3.5  3.5 - 5.2 g/dL   AST 41 (*) 0 - 37 U/L   ALT 46 (*) 0 - 35 U/L   Alkaline Phosphatase 164 (*) 39 - 117 U/L   Total Bilirubin 0.3  0.3 - 1.2 mg/dL   GFR calc non Af Amer >90  >90 mL/min   GFR calc Af Amer >90  >90 mL/min   Comment: (NOTE)     The eGFR has been calculated using the CKD EPI equation.     This calculation has not been validated in all clinical situations.     eGFR's persistently <90 mL/min signify  possible Chronic Kidney     Disease.  LIPASE, BLOOD     Status: None   Collection Time    12/17/13  3:48 PM      Result Value Ref Range   Lipase 44  11 - 59 U/L   No results found.  Review of Systems  Constitutional: Positive for fever and chills.  HENT: Negative.   Eyes: Negative.   Respiratory: Negative.   Cardiovascular: Negative.   Gastrointestinal: Positive for nausea and abdominal pain.  Genitourinary: Negative.   Musculoskeletal: Negative.   Skin: Negative.   Neurological: Negative.   Endo/Heme/Allergies: Negative.   Psychiatric/Behavioral: Negative.     Blood pressure 125/61, pulse 69, temperature 98.9 F (37.2 C), temperature source Oral, resp. rate 14, last menstrual period 10/15/2013, SpO2 98.00%. Physical Exam  Constitutional: She is oriented to person, place, and time. She appears well-developed and well-nourished.  HENT:  Head: Normocephalic and atraumatic.  Eyes: Pupils are equal, round, and reactive to light. No scleral icterus.  Neck: Normal range of motion. Neck supple.  Cardiovascular: Normal rate and regular rhythm.   Respiratory: Breath sounds normal.  GI: There is tenderness in the right upper quadrant and epigastric area. There is positive Murphy's sign.  Musculoskeletal: Normal range of motion.  Neurological: She is alert and oriented to person, place, and time.  Skin: Skin is warm and dry.   Psychiatric: She has a normal mood and affect. Her behavior is normal. Judgment and thought content normal.     Assessment/Plan Acute cholecystitis Admit IVF ABX PAIN CONTROL LAP CHOLE BY DOW NEXT 24 -48 HOURS   Dryden Tapley A. 12/18/2013, 12:02 AM

## 2013-12-18 NOTE — Anesthesia Postprocedure Evaluation (Signed)
  Anesthesia Post-op Note  Patient: Janet MustardMaria Salazar  Procedure(s) Performed: Procedure(s): LAPAROSCOPIC CHOLECYSTECTOMY (N/A)  Patient Location: PACU  Anesthesia Type:General  Level of Consciousness: awake and alert   Airway and Oxygen Therapy: Patient Spontanous Breathing  Post-op Pain: mild  Post-op Assessment: Post-op Vital signs reviewed  Post-op Vital Signs: stable  Complications: No apparent anesthesia complications

## 2013-12-18 NOTE — ED Provider Notes (Signed)
Medical screening examination/treatment/procedure(s) were conducted as a shared visit with non-physician practitioner(s) or resident and myself. I personally evaluated the patient during the encounter and agree with the findings and plan unless otherwise indicated.  I have personally reviewed any xrays and/ or EKG's with the provider and I agree with interpretation.  Worsening RUQ pain/ vomiting, unable to tolerate po. Similar to previous pain with known gallstones however more severe. Exam moderate RUQ pain, abd soft, mild dry mm, no distress. Bedside US showed large gallstones and mild wall thickening. G Surgery consulted for admission. Pain meds given. Subj fevers at home.  EMERGENCY DEPARTMENT BILIARY ULTRASOUND INTERPRETATION  "Study: Limited Abdominal Ultrasound of the gallbladder and common bile duct."  INDICATIONS: RUQ pain, Nausea and Vomiting  Indication: Multiple views of the gallbladder and common bile duct were obtained in real-time with a Multi-frequency probe."  PERFORMED BY: Myself  IMAGES ARCHIVED?: Yes  FINDINGS: Gallstones present, Gallbladder wall thickened >723mm and Sonographic Murphy's sign present  LIMITATIONS: Bowel Gas and pain  INTERPRETATION: Cholelithiasis and Cholecystitis  Cholelithiasis/ Biliary Colic, RUQ pain   Enid SkeensJoshua M Mithran Strike, MD 12/18/13 0130

## 2013-12-18 NOTE — Anesthesia Preprocedure Evaluation (Addendum)
Anesthesia Evaluation  Patient identified by MRN, date of birth, ID band Patient awake    Reviewed: Allergy & Precautions, H&P , NPO status , Patient's Chart, lab work & pertinent test results  Airway Mallampati: I TM Distance: >3 FB Neck ROM: Full and full    Dental  (+) Teeth Intact, Dental Advidsory Given   Pulmonary  breath sounds clear to auscultation        Cardiovascular Rhythm:Regular Rate:Normal     Neuro/Psych  Headaches,    GI/Hepatic Gallstones   Endo/Other    Renal/GU      Musculoskeletal   Abdominal   Peds  Hematology  (+) anemia ,   Anesthesia Other Findings   Reproductive/Obstetrics                          Anesthesia Physical Anesthesia Plan  ASA: I  Anesthesia Plan: General   Post-op Pain Management:    Induction: Intravenous  Airway Management Planned: Oral ETT  Additional Equipment:   Intra-op Plan:   Post-operative Plan: Extubation in OR  Informed Consent: I have reviewed the patients History and Physical, chart, labs and discussed the procedure including the risks, benefits and alternatives for the proposed anesthesia with the patient or authorized representative who has indicated his/her understanding and acceptance.   Dental advisory given and Dental Advisory Given  Plan Discussed with: CRNA, Surgeon and Anesthesiologist  Anesthesia Plan Comments:        Anesthesia Quick Evaluation

## 2013-12-18 NOTE — Interval H&P Note (Signed)
History and Physical Interval Note: no change in H and P.  Plan lap chole today.  Risks discussed  12/18/2013 7:29 AM  Janet Salazar  has presented today for surgery, with the diagnosis of gallstones  The various methods of treatment have been discussed with the patient and family. After consideration of risks, benefits and other options for treatment, the patient has consented to  Procedure(s) with comments: LAPAROSCOPIC CHOLECYSTECTOMY WITH INTRAOPERATIVE CHOLANGIOGRAM (N/A) - possible c-gram as a surgical intervention .  The patient's history has been reviewed, patient examined, no change in status, stable for surgery.  I have reviewed the patient's chart and labs.  Questions were answered to the patient's satisfaction.     Gavynn Duvall A

## 2013-12-18 NOTE — Transfer of Care (Signed)
Immediate Anesthesia Transfer of Care Note  Patient: Janet MustardMaria Salazar  Procedure(s) Performed: Procedure(s) with comments: LAPAROSCOPIC CHOLECYSTECTOMY WITH INTRAOPERATIVE CHOLANGIOGRAM (N/A) - possible c-gram  Patient Location: PACU  Anesthesia Type:General  Level of Consciousness: sedated, patient cooperative and lethargic  Airway & Oxygen Therapy: Patient Spontanous Breathing and Patient connected to nasal cannula oxygen  Post-op Assessment: Report given to PACU RN, Post -op Vital signs reviewed and stable and Patient moving all extremities X 4  Post vital signs: Reviewed and stable  Complications: No apparent anesthesia complications

## 2013-12-19 MED ORDER — OXYCODONE HCL 5 MG PO TABS: 5.0000 mg | ORAL_TABLET | ORAL | Status: DC | PRN

## 2013-12-19 NOTE — Discharge Summary (Signed)
I have seen and examined the patient and agree with the assessment and plans.  Kathline Banbury A. Deangelo Berns  MD, FACS  

## 2013-12-19 NOTE — Discharge Instructions (Signed)
CIRUGIA LAPAROSCOPICA: INSTRUCCIONES DE POST OPERATORIO. ° °Revise siempre los documentos que le entreguen en el lugar donde se ha hecho la cirugia. ° °SI USTED NECESITA DOCUMENTOS DE INCAPACIDAD (DISABLE) O DE PERMISO FAMILAR (FAMILY LEAVE) NECESITA TRAERLOS A LA OFICINA PARA QUE SEAN PROCESADOS. °NO  SE LOS DE A SU DOCTOR. °1. A su alta del hospital se le dara una receta para controlar el dolor. Tomela como ha sido recetada, si la necesita. Si no la necesita puede tomar, Acetaminofen (Tylenol) o Ibuprofen (Advil) para aliviar dolor moderado. °2. Continue tomando el resto de sus medicinas. °3. Si necesita rellenar la receta, llame a la farmacia. ellos contactan a nuestra oficina pidiendo autorizacion. Este tipo de receta no pueden ser rellenadas despues de las  5pm o durante los fines de semana. °4. Con relacion a la dieta: debe ser ligera los primeros dias despues que llege a la casa. Ejemplo: sopas y galleticas. Tome bastante liquido esos dias. °5. La mayoria de los pacientes padecen de inflamacion y cambio de coloracion de la piel alrededor de las incisiones. esto toma dias en resolver.  pnerse una bolsa de hielo en el area affectada ayuda..  °6. Es comun tambien tener un poco de estrenimiento si esta tomado medicinas para el dolor. incremente la cantidad de liquidos a tomar y puede tomar (Colace) esto previene el problema. Si ya tiene estrenimiento, es decir no ha defecado en 48 horas, puede tomar un laxativo (Milk of Magnesia or Miralax) uselo como el paquete le explica. °7.  A menos que se le diga algo diferente. Remueva el bendaje a las 24-48 horas despues dela cirugia. y puede banarse en la ducha sin ningun problema. usted puede tener steri-strips (pequenas curitas transparentes en la piel puesta encima de la incision)  Estas banditas strips should be left on the skin for 7-10 days.   Si su cirujano puso pegamento encima de la incision usted puede banarse bajo la ducha en 24 horas. Este pegamento empezara a  caerse en las proximas 2-3 semanas. Si le pusieron suturas o presillas (grapos) estos seran quitados en su proxima cita en la oficina. . °a. ACTIVIDADES:  Puede hacer actividad ligera.  Como caminar , subir escaleras y poco a poco irlas incrementando tanto como las tolere. Puede tener relaciones sexuales cuando sea comfortable. No carge objetos pesados o haga esfuerzos que no sean aprovados por su doctor. °b. Puede manejar en cuanto no esta tomando medicamentos fuertes (narcoticos) para el dolor, pueda abrochar confortablemente el cinturon de seguridad, y pueda maniobrar y usar los pedales de su vehiculo con seguridad. °c. PUEDE REGRESAR A TRABAJAR  °8. Debe ver a su doctor para una cita de seguimiento en 2-3 semanas despues de la cirugia.  °9. OTRAS ISNSTRUCCIONES:___________________________________________________________________________________ °CUANDO LLAMAR A SU MEDICO: °1. FIEBRE mayor de  101.0 °2. No produccion de orina. °3. Sangramiento continue de la herida °4. Incremento de dolor, enrojecimientio o drenaje de la herida (incision) °5. Incremento de dolor abdominal. ° °The clinic staff is available to answer your questions during regular business hours.  Please don’t hesitate to call and ask to speak to one of the nurses for clinical concerns.  If you have a medical emergency, go to the nearest emergency room or call 911.  A surgeon from [facility] is always on call at the hospital. °[address], [address], [place], [place]  [address] ? [address] [address], [place], [place]   [address] °[phone] ? [phone] ? FAX [phone] °Web site: [email or web address] ° ° °

## 2013-12-19 NOTE — Discharge Summary (Signed)
Patient ID: Janet MustardMaria Salazar MRN: [id number] DOB/AGE: 1960-12-04 53 y.o.  Admit date: 12/17/2013 Discharge date: 12/19/2013  Procedures: laparoscopic cholecystectomy  Consults: None  Reason for Admission: PT HAS HISTORY OF GALLSTONES WITH INTERMITTENT RUQ PAIN FOR 6 MONTHS. PAIN WORSENED OVER LAST DAY AND IS NOW CONSTANT. PT HAS N/V AS WELL. PT SEEN IN ED 5 DAYS AGO WHERE U/S SHOWS MULTIPLE GALLSTONES. PAIN NOT BETTER WITH MEDICATION. LOCATION RUQ. SHARP. WORSENED TODAY. DAUGHTER AT BEDSIDE TO TRANSLATE.  Admission Diagnoses:  1. Acute cholecystitis  Hospital Course: the patient was admitted and taken to the OR the following morning.  She underwent a lap chole which she tolerated well.  She had her pain controlled and was tolerating solid food on POD 1.  She was stable for dc home.  PE: Abd: soft, appropriately tender, +BS, ND, incisions c/d/i  Discharge Diagnoses:  Active Problems:   Acute cholecystitis s/p lap chole  Discharge Medications:   Medication List    STOP taking these medications       oxyCODONE-acetaminophen 5-325 MG per tablet  Commonly known as:  PERCOCET      TAKE these medications       acetaminophen 500 MG tablet  Commonly known as:  TYLENOL  Take 1,500 mg by mouth daily as needed for mild pain.     MULTIVITAMIN PO  Take 1 tablet by mouth daily.     ondansetron 4 MG disintegrating tablet  Commonly known as:  ZOFRAN ODT  4mg  ODT q4 hours prn nausea/vomit     oxyCODONE 5 MG immediate release tablet  Commonly known as:  ROXICODONE  Take 1-2 tablets (5-10 mg total) by mouth every 4 (four) hours as needed for severe pain.        Discharge Instructions:     Follow-up Information   Follow up with [facility] On 01/13/2014. (2:00pm, arrive at 1:30pm for paperwork)    Contact information:   [address]   [place] [id number] [phone]       Signed: Letha CapeOSBORNE,Johncarlos Holtsclaw E 12/19/2013, 8:21 AM

## 2013-12-19 NOTE — Discharge Planning (Signed)
Copy of AVS in Spanish to pt who verbalizes understanding.  D'cd per w/c with all personal belongings acomp. By family to private car home.

## 2013-12-22 ENCOUNTER — Ambulatory Visit (INDEPENDENT_AMBULATORY_CARE_PROVIDER_SITE_OTHER): Payer: Self-pay | Admitting: General Surgery

## 2013-12-23 ENCOUNTER — Encounter (HOSPITAL_COMMUNITY): Payer: Self-pay | Admitting: Surgery

## 2014-01-13 ENCOUNTER — Encounter (INDEPENDENT_AMBULATORY_CARE_PROVIDER_SITE_OTHER): Payer: Self-pay | Admitting: General Surgery

## 2014-01-15 ENCOUNTER — Encounter (INDEPENDENT_AMBULATORY_CARE_PROVIDER_SITE_OTHER): Payer: Self-pay | Admitting: General Surgery

## 2016-03-01 ENCOUNTER — Ambulatory Visit (INDEPENDENT_AMBULATORY_CARE_PROVIDER_SITE_OTHER): Payer: Self-pay | Admitting: Urgent Care

## 2016-03-01 ENCOUNTER — Ambulatory Visit: Payer: Self-pay

## 2016-03-01 ENCOUNTER — Ambulatory Visit (INDEPENDENT_AMBULATORY_CARE_PROVIDER_SITE_OTHER): Payer: Self-pay

## 2016-03-01 VITALS — BP 130/70 | HR 66 | Temp 97.9°F | Resp 16 | Ht <= 58 in | Wt 148.0 lb

## 2016-03-01 DIAGNOSIS — F329 Major depressive disorder, single episode, unspecified: Secondary | ICD-10-CM

## 2016-03-01 DIAGNOSIS — M549 Dorsalgia, unspecified: Principal | ICD-10-CM

## 2016-03-01 DIAGNOSIS — Z0000 Encounter for general adult medical examination without abnormal findings: Secondary | ICD-10-CM

## 2016-03-01 DIAGNOSIS — G8929 Other chronic pain: Secondary | ICD-10-CM

## 2016-03-01 DIAGNOSIS — R319 Hematuria, unspecified: Secondary | ICD-10-CM

## 2016-03-01 DIAGNOSIS — M62830 Muscle spasm of back: Secondary | ICD-10-CM

## 2016-03-01 DIAGNOSIS — R0789 Other chest pain: Secondary | ICD-10-CM

## 2016-03-01 DIAGNOSIS — M542 Cervicalgia: Secondary | ICD-10-CM

## 2016-03-01 DIAGNOSIS — M419 Scoliosis, unspecified: Secondary | ICD-10-CM

## 2016-03-01 DIAGNOSIS — R7303 Prediabetes: Secondary | ICD-10-CM

## 2016-03-01 DIAGNOSIS — F32A Depression, unspecified: Secondary | ICD-10-CM

## 2016-03-01 DIAGNOSIS — Z113 Encounter for screening for infections with a predominantly sexual mode of transmission: Secondary | ICD-10-CM

## 2016-03-01 DIAGNOSIS — M539 Dorsopathy, unspecified: Secondary | ICD-10-CM

## 2016-03-01 LAB — POCT URINALYSIS DIP (MANUAL ENTRY)
BILIRUBIN UA: NEGATIVE
BILIRUBIN UA: NEGATIVE
Glucose, UA: NEGATIVE
LEUKOCYTES UA: NEGATIVE
Nitrite, UA: NEGATIVE
Protein Ur, POC: NEGATIVE
Spec Grav, UA: 1.015
Urobilinogen, UA: 0.2
pH, UA: 5.5

## 2016-03-01 LAB — COMPREHENSIVE METABOLIC PANEL
ALT: 32 U/L — ABNORMAL HIGH (ref 6–29)
AST: 27 U/L (ref 10–35)
Albumin: 4.2 g/dL (ref 3.6–5.1)
Alkaline Phosphatase: 93 U/L (ref 33–130)
BUN: 17 mg/dL (ref 7–25)
CALCIUM: 9.4 mg/dL (ref 8.6–10.4)
CO2: 27 mmol/L (ref 20–31)
Chloride: 105 mmol/L (ref 98–110)
Creat: 0.62 mg/dL (ref 0.50–1.05)
GLUCOSE: 105 mg/dL — AB (ref 65–99)
POTASSIUM: 4.6 mmol/L (ref 3.5–5.3)
Sodium: 139 mmol/L (ref 135–146)
Total Bilirubin: 0.4 mg/dL (ref 0.2–1.2)
Total Protein: 7.5 g/dL (ref 6.1–8.1)

## 2016-03-01 LAB — POC MICROSCOPIC URINALYSIS (UMFC): MUCUS RE: ABSENT

## 2016-03-01 LAB — POCT CBC
GRANULOCYTE PERCENT: 41.9 % (ref 37–80)
HEMATOCRIT: 35.9 % — AB (ref 37.7–47.9)
HEMOGLOBIN: 12.5 g/dL (ref 12.2–16.2)
Lymph, poc: 4 — AB (ref 0.6–3.4)
MCH: 29.8 pg (ref 27–31.2)
MCHC: 34.9 g/dL (ref 31.8–35.4)
MCV: 85.3 fL (ref 80–97)
MID (cbc): 0.6 (ref 0–0.9)
MPV: 7.4 fL (ref 0–99.8)
POC GRANULOCYTE: 3.3 (ref 2–6.9)
POC LYMPH PERCENT: 50.4 %L — AB (ref 10–50)
POC MID %: 7.7 %M (ref 0–12)
Platelet Count, POC: 282 10*3/uL (ref 142–424)
RBC: 4.2 M/uL (ref 4.04–5.48)
RDW, POC: 13.5 %
WBC: 7.9 10*3/uL (ref 4.6–10.2)

## 2016-03-01 LAB — LIPID PANEL
Cholesterol: 203 mg/dL — ABNORMAL HIGH (ref 125–200)
HDL: 36 mg/dL — AB (ref >=46)
LDL CALC: 130 mg/dL — AB (ref <130)
TRIGLYCERIDES: 185 mg/dL — AB (ref <150)
Total CHOL/HDL Ratio: 5.6 Ratio — ABNORMAL HIGH (ref <=5.0)
VLDL: 37 mg/dL — ABNORMAL HIGH (ref <30)

## 2016-03-01 LAB — POCT GLYCOSYLATED HEMOGLOBIN (HGB A1C): HEMOGLOBIN A1C: 6.1

## 2016-03-01 LAB — TSH: TSH: 6.17 m[IU]/L — AB

## 2016-03-01 LAB — HIV ANTIBODY (ROUTINE TESTING W REFLEX): HIV 1&2 Ab, 4th Generation: NONREACTIVE

## 2016-03-01 MED ORDER — CYCLOBENZAPRINE HCL 5 MG PO TABS: 5.0000 mg | ORAL_TABLET | Freq: Three times a day (TID) | ORAL | Status: DC

## 2016-03-01 MED ORDER — FAMOTIDINE 20 MG PO TABS: 20.0000 mg | ORAL_TABLET | Freq: Two times a day (BID) | ORAL | Status: DC

## 2016-03-01 MED ORDER — CYCLOBENZAPRINE HCL ER 15 MG PO CP24: 15.0000 mg | ORAL_CAPSULE | Freq: Every day | ORAL | Status: DC | PRN

## 2016-03-01 MED ORDER — MELOXICAM 7.5 MG PO TABS: 7.5000 mg | ORAL_TABLET | Freq: Every day | ORAL | Status: DC

## 2016-03-01 MED ORDER — PREDNISONE 20 MG PO TABS: ORAL_TABLET | ORAL | Status: DC

## 2016-03-01 MED ORDER — FLUOXETINE HCL 20 MG PO TABS: 20.0000 mg | ORAL_TABLET | Freq: Every day | ORAL | Status: DC

## 2016-03-01 NOTE — Patient Instructions (Addendum)
Discopata degenerativa (Degenerative Disk Disease) La discopata degenerativa es una enfermedad causada por los cambios que se producen en los discos intervertebrales a medida que una persona envejece. Los discos intervertebrales son blandos y comprimibles, y se encuentran entre los huesos de la columna (vrtebras). Estos discos actan como amortiguadores de los [place]. La discopata degenerativa puede comprometer a toda la columna vertebral. Sin embargo, el cuello y parte inferior de la espalda son las zonas ms comnmente afectadas. Al envejecer, pueden producirse muchos cambios en los discos intervertebrales, por ejemplo:  Pueden secarse y encogerse.  Pueden formarse pequeos desgarros en el recubrimiento exterior duro del disco (anillo).  El espacio intervertebral puede reducirse debido a la prdida de [place].  Pueden desarrollarse crecimientos anormales en el hueso (espolones). Esto puede ejercer presin Colgate-Palmolive races nerviosas que salen del canal espinal y Engineer, production.  El canal espinal puede estrecharse. FACTORES DE RIESGO   Tener sobrepeso.  Tener antecedentes familiares de discopata degenerativa.  Fumar.  El riesgo es mayor si suele levantar objetos pesados o sufre una lesin repentina. [place] persona a otra y pueden incluir los siguientes:  Dolor de intensidad variable. Algunas personas no tienen dolor, mientras que otras sufren un dolor intenso. La ubicacin del dolor depende de la zona de la columna vertebral que est afectada.  Si se trata de un disco que se encuentra cerca de la zona del cuello, tendr dolor de cuello o del brazo.  Si la zona afectada es la parte inferior de la espalda, tendr dolor de [place], de los glteos o las piernas.  Dolor que empeora al agacharse, Liberty Global brazos o Optometrist movimientos de torsin.  Dolor que puede comenzar de [place] gradual y Film/video editor con el paso del [place]. Tambin puede  aparecer despus de sufrir una lesin leve o importante.  Hormigueo o adormecimiento de los brazos o las piernas. DIAGNSTICO  El mdico le preguntar cules son sus sntomas y Estate manager/land agent las actividades y los hbitos que pueden causar Conservation officer, historic buildings. Adems, PepsiCo y las enfermedades que tuvo o los tratamientos que recibi. El Viacom har un examen para determinar el rango de movimiento posible de la zona afectada, controlar la fuerza que tiene en las extremidades, as Therapist, occupational sensibilidad en las zonas de los brazos y las piernas inervadas por diferentes races nerviosas. Tambin se Educational psychologist los siguientes estudios:   Una radiografa de la columna.  Otros estudios por imgenes, como una RM. TRATAMIENTO  El Sport and exercise psychologist cul es el mejor plan de tratamiento. El tratamiento puede incluir lo siguiente:  Medicamentos.  Ejercicios de rehabilitacin. INSTRUCCIONES PARA EL CUIDADO EN EL HOGAR   Siga las tcnicas adecuadas para caminar y Lexicographer objetos pesados como se lo haya recomendado el mdico.  Mantenga una buena [place].  Haga ejercicios regularmente como se lo haya recomendado el [place] de relajacin.  Cambie los hbitos para sentarse, ponerse de pie y dormir como se lo haya recomendado el mdico.  Cambie frecuentemente de posicin.  Mantenga un peso saludable o baje de peso como se lo haya recomendado el mdico.  No consuma ningn producto que contenga tabaco, lo que incluye cigarrillos, tabaco de Higher education careers adviser o Psychologist, sport and exercise. Si necesita ayuda para dejar de fumar, consulte al mdico.  Use calzado que tenga el soporte correcto.  Tome los medicamentos solamente como se lo haya indicado el mdico. SOLICITE ATENCIN MDICA SI:   El dolor no desaparece  en el trmino de 1 a 4semanas.  Pierde drsticamente de peso o el apetito. SOLICITE ATENCIN MDICA DE INMEDIATO SI:   El dolor es intenso.  Tiene Colgate, las  manos o las piernas.  Comienza a perder el control de la vejiga o los intestinos.  Tiene fiebre o sudores nocturnos. ASEGRESE DE QUE:   Comprende estas instrucciones.  Controlar su afeccin.  Recibir ayuda de inmediato si no mejora o si empeora.   Esta informacin no tiene Theme park manager el consejo del mdico. Asegrese de hacerle al mdico cualquier pregunta que tenga.   Document Released: 01/11/2009 Document Revised: 10/16/2014 Elsevier Interactive Patient Education 2016 Arvin[employer]itor.    Dolor de pecho inespecfico  (Nonspecific Chest Pain) El dolor de pecho puede deberse a muchas enfermedades diferentes. Siempre existe una posibilidad de que el dolor est relacionado con algo grave, como un infarto de miocardio o un cogulo sanguneo en los pulmones. Hay muchas enfermedades que no son potencialmente mortales que pueden causar dolor de [place]. Si tiene Engineer, mining de Hotel manager, es muy importante que se controle con el mdico. CAUSAS  Las causas del dolor de pecho pueden ser las siguientes:  Acidez estomacal.  Neumona o bronquitis.  Ansiedad o estrs.  Inflamacin de la zona que rodea al corazn (pericarditis) o a los pulmones (pleuritis o pleuresa).  Un cogulo sanguneo en el pulmn.  Colapso de un pulmn (neumotrax), que puede aparecer de Regions Financial Corporation repentina por s solo (neumotrax espontneo) o debido a un traumatismo en el trax.  Culebrilla (virus de la varicela zster).  Infarto de miocardio.  Dao de los [place], los msculos y los cartlagos que conforman la pared torcica. Esto puede incluir lo siguiente:  Hematomas seos debido a lesiones.  Distensiones musculares o de los cartlagos por tos frecuente o repetida, o por exceso de trabajo.  Fractura de una o ms costillas.  Dolor de TEFL teacher debido a inflamacin (costocondritis). FACTORES DE RIESGO  Los factores de riesgo de tener dolor de pecho pueden incluir lo siguiente:  Actividades que incrementan el  riesgo de sufrir traumatismos o lesiones en el trax.  Infecciones o enfermedades respiratorias que causan tos frecuente.  Enfermedades o Eastman Kodak comidas que pueden causar Engineering geologist.  Enfermedades cardacas o antecedentes familiares de enfermedades cardacas.  Enfermedades o comportamientos de salud que aumentan el riesgo de tener un cogulo sanguneo.  Haber tenido varicela (varicela zster). SIGNOS Y SNTOMAS El dolor de pecho puede provocar las siguientes sensaciones:  Ardor u hormigueo en la superficie o en lo profundo del pecho.  Dolor opresivo, continuo o constrictivo.  Dolor vago o intenso que empeora al Cl[employer] Company, toser o inhalar profundamente.  Dolor que tambin se siente en la espalda, el cuello, el hombro o el brazo, o dolor que se irradia a cualquiera de estas zonas. El dolor de pecho puede aparecer y Geneticist, molecular, o bien puede ser constante. DIAGNSTICO Gretchen Short se necesiten anlisis de laboratorio u otros estudios para Veterinary surgeon causa del Engineer, mining. El mdico puede indicarle que se haga una prueba llamada EGC (electrocadiograma) ambulatorio. El Regulatory affairs officer los patrones de los latidos cardacos en el momento en que se realiza el [place]. Tambin pueden hacerle otros estudios, por ejemplo:  Ecocardiograma transtorcico (ETT). Durante el ecocardiograma, se usan ondas sonoras para crear una imagen de todas las estructuras cardacas y evaluar cmo circula la sangre por el corazn.  Ecocardiograma transesofgico (ETE).Este es un estudio de diagnstico por imgenes ms avanzado que el obtiene imgenes del interior del  cuerpo. Le permite al mdico ver el corazn con mayor detalle.  Monitoreo cardaco. Permite que el mdico controle la frecuencia y el ritmo cardaco en tiempo real.  Monitor Holter. Es un dispositivo porttil que eBayregistra los latidos del corazn y puede ayudar a Education administratordiagnosticar las arritmias cardacas. Le permite al American Expressmdico registrar la actividad  cardaca durante varios das, si es necesario.  Pruebas de esfuerzo. Estas pueden realizarse durante el ejercicio o mediante la administracin de un medicamento que acelera los latidos del corazn.  Anlisis de [place].  Diagnstico por imgenes. TRATAMIENTO  El tratamiento depende de la causa del dolor de [place]. El tratamiento puede incluir lo siguiente:  Medicamentos. Estos pueden incluir lo siguiente:  Inhibidores de Publishing copyla acidez estomacal.  Antiinflamatorios.  Analgsicos para las enfermedades inflamatorias.  Antibiticos, si hay una infeccin.  Medicamentos para Northwest Airlinesdisolver los cogulos sanguneos.  Medicamentos para tratar la enfermedad arterial coronaria.  Tratamiento complementario para las enfermedades que no requieren la toma de medicamentos. Esto puede incluir lo siguiente:  Descansar.  Aplicar compresas fras o calientes en las zonas lesionadas.  Limitar las actividades hasta que Erie Insurance Groupdisminuya el dolor. INSTRUCCIONES PARA EL CUIDADO EN EL HOGAR  Si le recetaron antibiticos, asegrese de terminarlos, incluso si comienza a sentirse mejor.  Evite las SUPERVALU INCactividades que le causen dolor de [place]cho.  No consuma ningn producto que contenga tabaco, lo que incluye cigarrillos, tabaco de Theatre managermascar o Administrator, Civil Servicecigarrillos electrnicos. Si necesita ayuda para dejar de fumar, consulte al mdico.  No beba alcohol.  Tome los medicamentos solamente como se lo haya indicado el mdico.  Concurra a todas las visitas de control como se lo haya indicado el mdico. Esto es importante. Esto incluye otros estudios si el dolor de pecho no desaparece.  Si la acidez es la causa del dolor de [place]cho, tal vez le aconsejen que mantenga la cabeza levantada (elevada) mientras duerme. Esto reduce la probabilidad de que el cido retroceda del estmago al esfago.  Haga cambios en su estilo de vida como se lo haya indicado el mdico. Estos pueden incluir lo siguiente:  Education administratorracticar actividad fsica con regularidad. Pida  al mdico que le sugiera algunas actividades que sean seguras para usted.  Consumir una dieta cardiosaludable. Un nutricionista matriculado puede ayudarlo a Software engineerhacer elecciones saludables.  Mantener un peso saludable.  Controlar la diabetes, si es necesario.  Reducir las situaciones de estrs. SOLICITE ATENCIN MDICA SI:  El dolor de pecho no desaparece despus del tratamiento.  Tiene una erupcin cutnea con ampollas en el pecho.  Tiene fiebre. SOLICITE ATENCIN MDICA DE INMEDIATO SI:   El dolor en el pecho es ms intenso.  La tos empeora, o expectora sangre.  Siente un dolor abdominal intenso.  Siente debilidad intensa.  Se desmaya.  Tiene escalofros.  Tiene una molestia repentina e inexplicable en el pecho.  Tiene molestias repentinas e Exxon Mobil Corporationinexplicables en los brazos, la espalda, el cuello o la Somersmandbula.  Le falta el aire en cualquier momento.  Comienza a sudar de Hondurasmanera repentina o la piel se le humedece.  Siente nuseas o vomita.  Se siente repentinamente mareado o se desmaya.  Siente que el corazn comienza a latir rpidamente o que se saltea latidos. Estos sntomas pueden representar un problema grave que constituye Radio broadcast assistantuna emergencia. No espere hasta que los sntomas desaparezcan. Solicite atencin mdica de inmediato. Comunquese con el servicio de emergencias de su localidad (911 en los [place]). No conduzca por sus propios medios OfficeMax Incorporatedhasta el hospital.   Esta informacin no tiene como fin  reemplazar el consejo del mdico. Asegrese de hacerle al mdico cualquier pregunta que tenga.   Document Released: 09/25/2005 Document Revised: 10/16/2014 Elsevier Interactive Patient Education 2016 ArvinMeritor.    Plan de alimentacin para la prediabetes (Prediabetes Eating Plan) La prediabetes, tambin llamada intolerancia a la glucosa o alteracin de la glucosa en ayunas, es una afeccin que eleva los niveles de azcar en la sangre (glucemia) por encima de lo  normal. Seguir una dieta saludable puede ayudar a mantener la prediabetes bajo control, y tambin reduce el riesgo de tener diabetes tipo2 y cardiopata, que es ms alto en las personas que tienen esta afeccin. Junto con la actividad fsica habitual, una dieta saludable:  Promueve la prdida de [place].  Ayuda a Medical sales representative de Banker.  Ayuda a mejorar la forma en que el organismo [place] la insulina. QU DEBO SABER ACERCA DE ESTE PLAN DE ALIMENTACIN?  Use el ndice glucmico (IG) para planificar las comidas. El ndice le informa con qu rapidez un alimento elevar su nivel de azcar en la sangre. Elija los alimentos con bajo IG. Estos tardan ms tiempo en subir el nivel de azcar en la sangre.  Preste mucha atencin a la cantidad de hidratos de carbono que hay en los alimentos que consume. Los hidratos de carbono OfficeMax Incorporated niveles de Banker.  Lleve un registro de la cantidad de caloras que ingiere. Ingerir la cantidad correcta de caloras lo ayudar a Cabin crew peso saludable. Bajar alrededor del 7por ciento del peso inicial puede ayudar a Automotive engineer la diabetes tipo2.  Tal vez deba seguir Clinical cytogeneticist. Esta incluye una gran cantidad de verduras, carnes magras o pescado, cereales integrales, frutas, as como aceites y grasas saludables. QU ALIMENTOS PUEDO COMER? Cereales Cereales integrales, como panes, galletas, cereales y pastas de salvado o integrales. Avena sin azcar. Trigo burgol. Cebada. Quinua. Arroz integral. Tortillas o tacos de harina de maz o de salvado. Hoover Brunette Deatra James. [place]. Guisantes. Remolachas. Coliflor. Repollo. Brcoli. Zanahorias. Tomates. Calabaza. Christella Noa. Hierbas. Pimientos. Cebollas. Pepinos. Repollitos de Bruselas. Frutas Frutos rojos. Bananas. Manzanas. Naranjas. Uvas. Papaya. Mango. Granada. Kiwi. Pomelo. Cerezas. Carnes y otras fuentes de protenas Mariscos. Carnes [place], entre ellas, pollo y [place] o cortes  magros de carne de cerdo y de [place]. Tofu. Huevos. Los frutos secos. Frijoles. Lcteos Productos lcteos descremados o semidescremados, como yogur, queso cottage y [place]. CHS Inc. T. Caf. Gaseosas sin azcar o dietticas. Agua de [place] Cerrillos. Leche. Productos alternativos de la [place], [address] de soja o de [place]. Condimentos [place]aza. Salsa de pepinillos. [place] con bajo contenido de [place] (the territory South of 60 deg S) y de International aid/development worker. Salsa barbacoa con bajo contenido de grasa y de azcar. Mayonesa sin grasa o con bajo contenido de [place]. Dulces y postres Budines sin azcar o con bajo contenido de [place]. Helados y otros dulces congelados sin azcar o con bajo contenido de [place]. Grasas y Arts development officer. Nueces. Aceite de oliva. Los artculos mencionados arriba pueden no ser Raytheon de las bebidas o los alimentos recomendados. Comunquese con el nutricionista para conocer ms opciones. QU ALIMENTOS NO SE RECOMIENDAN? Cereales Productos a base de [place] y de [place], como panes, pastas, bocadillos y cereales. Bebidas Bebidas azucaradas, como t helado y gaseosas con International aid/development worker. Dulces y [address] de [place], [place] tortas, [place], [place], Programmer, systems y tarta de [place]. Los artculos mencionados arriba pueden no ser Raytheon de las bebidas y los alimentos que se Theatre stage manager. Comunquese con el nutricionista para obtener  ms informacin.   Esta informacin no tiene Theme park manager el consejo del mdico. Asegrese de hacerle al mdico cualquier pregunta que tenga.   Document Released: 06/16/2015 Elsevier Interactive Patient Education Yahoo[employer] Inc.     IF you received an x-ray today, you will receive an invoice from [facility]. Please contact [facility] at [phone] with questions or concerns regarding your invoice.   IF you received labwork today, you will receive an invoice from [employer]. Please contact  [facility] at [phone] with questions or concerns regarding your invoice.   Our billing staff will not be able to assist you with questions regarding bills from these companies.  You will be contacted with the lab results as soon as they are available. The fastest way to get your results is to activate your My Chart account. Instructions are located on the last page of this paperwork. If you have not heard from [place] regarding the results in 2 weeks, please contact this office.

## 2016-03-01 NOTE — Progress Notes (Signed)
MRN: [id number] DOB: 1961-08-07  Subjective:   Janet Salazar is a 55 y.o. female presenting for Annual Exam; STD Testing; and Requesting blood work  PCP - None. Vision - Has not had an eye exam since 2014. Dental - Does not get dental care. No dental insurance. OB/GYN - Last pap smear was in 2016. Had normal findings, has never had abnormal pap smear. Specialists - Has not had colonoscopy. Has not had a mammogram 2015. This was her only mammogram and was normal.  Patient is single, has 3 adult children. Has good relationships with her family. Work in Presenter, broadcasting at [employer]. Diet is unhealthy. Does not exercise. Denies smoking cigarettes or drinking alcohol. She would like an STD check today.  Chest pain - Reports 2 month history of intermittent mid-sternal chest pain. Pain is very superficial, pain is sharp, is very transient lasting less than 5 seconds. Pain is not associated with food, non-radiating. Has not tried any medications for this. Denies fever, shob, heart racing, neck pain, limb pain, jaw pain, diaphoresis, n/v, abdominal pain.   Back Pain and Headaches - Reports longstanding history of intermittent headaches, neck stiffness and pain. Has longstanding history of progressively worsening back pain worst in her midback. Stands or walks most of her day at work. She also has difficulty sleeping and difficulty controlling her worry with her children's on personal issues. Feels that her energy is low, fatigue, does not really do any thing for fun or relaxation. Denies SI, HI. Has never tried antidepressant medications before.   Pre-diabetes - Was previously told that she had elevated blood sugar. She would like this checked today.   Janet Salazar currently has no medications in their medication list. Also has No Known Allergies.  Janet Salazar  has a past medical history of Migraines. Also  has past surgical history that includes Appendectomy and Cholecystectomy (N/A, 12/18/2013).  Her  family history includes Diabetes in her mother; Hypertension in her mother.   Immunizations - TDAP was 2013.  Objective:   Vitals: BP 130/70 mmHg  Pulse 66  Temp(Src) 97.9 F (36.6 C) (Oral)  Resp 16  Ht 4\' 9"  (1.448 m)  Wt 148 lb (67.132 kg)  BMI 32.02 kg/m2  SpO2 98%  LMP 10/15/2013  Physical Exam  Constitutional: She is oriented to person, place, and time. She appears well-developed and well-nourished.  HENT:  TM's intact bilaterally, no effusions or erythema. Nasal turbinates pink and moist, nasal passages patent. No sinus tenderness. Oropharynx clear, mucous membranes moist, dentition in good repair.  Eyes: Conjunctivae and EOM are normal. Pupils are equal, round, and reactive to light. Right eye exhibits no discharge. Left eye exhibits no discharge. No scleral icterus.  Neck: Normal range of motion. Neck supple. No thyromegaly present.  Cardiovascular: Normal rate, regular rhythm and intact distal pulses.  Exam reveals no gallop and no friction rub.   No murmur heard. Pulmonary/Chest: No respiratory distress. She has no wheezes. She has no rales.  Abdominal: Soft. Bowel sounds are normal. She exhibits no distension and no mass. There is tenderness (right sided, generalized with deep palpation).  Musculoskeletal: She exhibits no edema.       Cervical back: She exhibits decreased range of motion (flexion, extension), tenderness (over areas outlined) and spasm (over para-spinal muscles). She exhibits no bony tenderness, no swelling, no edema and no deformity.       Thoracic back: She exhibits tenderness (over spinous processes and paraspinal muscles), deformity (slight curvature of spine to  the right) and spasm. She exhibits normal range of motion, no bony tenderness, no swelling, no edema and no laceration.       Lumbar back: She exhibits decreased range of motion (flexion and extension), bony tenderness (at midline and paraspinal muscles) and spasm. She exhibits no tenderness,  no swelling, no edema, no deformity and no laceration.       Back:  Lymphadenopathy:    She has no cervical adenopathy.  Neurological: She is alert and oriented to person, place, and time. She has normal reflexes.  Skin: Skin is warm and dry. No rash noted. No erythema. No pallor.  Psychiatric:  Patient has flat affect.   Dg Cervical Spine 2 Or 3 Views  03/01/2016  CLINICAL DATA:  Chronic back pain. EXAM: CERVICAL SPINE - 2-3 VIEW COMPARISON:  No prior. FINDINGS: Diffuse multilevel mild degenerative change. Normal alignment. No acute abnormality identified. No evidence of fracture. Pulmonary apices are clear. IMPRESSION: Diffuse multilevel mild degenerative change.  No acute abnormality. Electronically Signed   By: Maisie Fushomas  Register   On: 03/01/2016 12:34   Dg Thoracic Spine 2 View  03/01/2016  CLINICAL DATA:  Chronic pain. EXAM: LUMBAR SPINE - 2-3 VIEW; THORACIC SPINE 2 VIEWS COMPARISON:  No prior. FINDINGS: Mild thoracolumbar spine scoliosis. Diffuse degenerative change. No acute abnormality identified. No evidence of fracture. IMPRESSION: Mild thoracolumbar spine scoliosis. Diffuse multilevel degenerative change. No acute or focal abnormality. Electronically Signed   By: Maisie Fushomas  Register   On: 03/01/2016 12:35   Dg Lumbar Spine 2-3 Views  03/01/2016  CLINICAL DATA:  Chronic pain. EXAM: LUMBAR SPINE - 2-3 VIEW; THORACIC SPINE 2 VIEWS COMPARISON:  No prior. FINDINGS: Mild thoracolumbar spine scoliosis. Diffuse degenerative change. No acute abnormality identified. No evidence of fracture. IMPRESSION: Mild thoracolumbar spine scoliosis. Diffuse multilevel degenerative change. No acute or focal abnormality. Electronically Signed   By: Maisie Fushomas  Register   On: 03/01/2016 12:35    Results for orders placed or performed in visit on 03/01/16 (from the past 24 hour(s))  POCT CBC     Status: Abnormal   Collection Time: 03/01/16 11:54 AM  Result Value Ref Range   WBC 7.9 4.6 - 10.2 K/uL   Lymph, poc 4.0  (A) 0.6 - 3.4   POC LYMPH PERCENT 50.4 (A) 10 - 50 %L   MID (cbc) 0.6 0 - 0.9   POC MID % 7.7 0 - 12 %M   POC Granulocyte 3.3 2 - 6.9   Granulocyte percent 41.9 37 - 80 %G   RBC 4.20 4.04 - 5.48 M/uL   Hemoglobin 12.5 12.2 - 16.2 g/dL   HCT, POC 65.735.9 (A) 84.637.7 - 47.9 %   MCV 85.3 80 - 97 fL   MCH, POC 29.8 27 - 31.2 pg   MCHC 34.9 31.8 - 35.4 g/dL   RDW, POC 96.213.5 %   Platelet Count, POC 282 142 - 424 K/uL   MPV 7.4 0 - 99.8 fL  POCT glycosylated hemoglobin (Hb A1C)     Status: None   Collection Time: 03/01/16 11:55 AM  Result Value Ref Range   Hemoglobin A1C 6.1   POCT urinalysis dipstick     Status: Abnormal   Collection Time: 03/01/16 12:14 PM  Result Value Ref Range   Color, UA yellow yellow   Clarity, UA hazy (A) clear   Glucose, UA negative negative   Bilirubin, UA negative negative   Ketones, POC UA negative negative   Spec Grav, UA 1.015  Blood, UA moderate (A) negative   pH, UA 5.5    Protein Ur, POC negative negative   Urobilinogen, UA 0.2    Nitrite, UA Negative Negative   Leukocytes, UA Negative Negative  POCT Microscopic Urinalysis ([facility])     Status: Abnormal   Collection Time: 03/01/16 12:21 PM  Result Value Ref Range   WBC,UR,HPF,POC Few (A) None WBC/hpf   RBC,UR,HPF,POC Few (A) None RBC/hpf   Bacteria Few (A) None, Too numerous to count   Mucus Absent Absent   Epithelial Cells, UR Per Microscopy Few (A) None, Too numerous to count cells/hpf   Assessment and Plan :   1. Annual physical exam - Medically stable. Discussed healthy lifestyle, diet, exercise, preventative care, vaccinations, and addressed patient's concerns.  - Patient declined referrals for mammogram, colonoscopy.   2. Atypical chest pain - Likely musculoskeletal in origin, management as below. I will address GERD component with H-2 blocker. Patient agreed.  3. Multilevel degenerative disc disease 4. Scoliosis 5. Muscle spasm of back - Counseled on diagnosis, offered short steroid  course, muscle relaxant. Recommended patient start strengthening core, perform back exercises. She is to use meloxicam as needed after she is finished with steroid course. RTC in 1-2 weeks if no improvement.   6. Pre-diabetes - Advised lifestyle modifications  7. Hematuria - Mild hematuria on urine micro, asymptomatic, monitor and recheck at next visit.  8. Depression - Start fluoxetine, recheck in 6-8 weeks.  9. Screening for STD (sexually transmitted disease) - HIV antibody - RPR - GC/Chlamydia Probe Amp   Wallis Bamberg, PA-C [facility] [phone] 03/01/2016 11:07 AM

## 2016-03-02 LAB — GC/CHLAMYDIA PROBE AMP
CT PROBE, AMP APTIMA: NOT DETECTED
GC PROBE AMP APTIMA: NOT DETECTED

## 2016-03-03 LAB — RPR

## 2016-03-07 ENCOUNTER — Telehealth: Payer: Self-pay | Admitting: Urgent Care

## 2016-03-07 MED ORDER — LEVOTHYROXINE SODIUM 50 MCG PO TABS: 50.0000 ug | ORAL_TABLET | Freq: Every day | ORAL | Status: AC

## 2016-03-07 NOTE — Telephone Encounter (Signed)
Left VM for patient. Due to her TSH and plethora of symptoms, I will have patient start levothyroxine. Recheck in 6 weeks. Also recommended dietary modifications due to her lipid panel. Recheck in 6 months. CMet is normal. STI check was negative.

## 2016-05-01 ENCOUNTER — Other Ambulatory Visit: Payer: Self-pay | Admitting: Obstetrics and Gynecology

## 2016-05-01 DIAGNOSIS — N644 Mastodynia: Secondary | ICD-10-CM

## 2016-05-01 DIAGNOSIS — N6452 Nipple discharge: Secondary | ICD-10-CM

## 2016-05-11 ENCOUNTER — Other Ambulatory Visit: Payer: Self-pay

## 2016-05-11 ENCOUNTER — Ambulatory Visit
Admission: RE | Admit: 2016-05-11 | Discharge: 2016-05-11 | Disposition: A | Discharge status: Home / Self Care | Payer: No Typology Code available for payment source | Source: Ambulatory Visit | Attending: Obstetrics and Gynecology | Admitting: Obstetrics and Gynecology

## 2016-05-11 ENCOUNTER — Encounter (HOSPITAL_COMMUNITY): Payer: Self-pay

## 2016-05-11 ENCOUNTER — Ambulatory Visit (HOSPITAL_COMMUNITY)
Admission: RE | Admit: 2016-05-11 | Discharge: 2016-05-11 | Disposition: A | Discharge status: Home / Self Care | Payer: Self-pay | Source: Ambulatory Visit | Attending: Obstetrics and Gynecology | Admitting: Obstetrics and Gynecology

## 2016-05-11 ENCOUNTER — Telehealth: Payer: Self-pay

## 2016-05-11 VITALS — BP 140/80 | Temp 97.7°F | Ht <= 58 in | Wt 149.9 lb

## 2016-05-11 DIAGNOSIS — N6452 Nipple discharge: Secondary | ICD-10-CM

## 2016-05-11 DIAGNOSIS — N644 Mastodynia: Secondary | ICD-10-CM

## 2016-05-11 DIAGNOSIS — Z01419 Encounter for gynecological examination (general) (routine) without abnormal findings: Secondary | ICD-10-CM

## 2016-05-11 HISTORY — DX: Disorder of thyroid, unspecified: E07.9

## 2016-05-11 NOTE — Telephone Encounter (Signed)
Called to tell about WISEWOMAN program and see if interested in being a part of the program. Left message to return call. 

## 2016-05-11 NOTE — Progress Notes (Signed)
Complaints of bilateral outer breast pain and left breast bloody discharge that first noticed two weeks ago. Patient stated the pain comes and goes. Patient rated pain at a 6-7 out of 10. Patient stated the left discharge looked like pus two days ago.  Pap Smear: Pap smear completed today. Last Pap smear was 6-7 years ago in [place] and normal per patient. Per patient has no history of an abnormal Pap smear. No Pap smear results are in EPIC.  Physical exam: Breasts Breasts symmetrical. No skin abnormalities bilateral breasts. No nipple retraction bilateral breasts. No nipple discharge right breast. Scant amount of clear colored left breast discharge observed on exam. Not able to get enough sample to send to cytology. No lymphadenopathy. No lumps palpated bilateral breasts. Complaints of bilateral outer breast tenderness on exam. Referred patient to the [facility] for a diagnostic mammogram. Appointment scheduled for Thursday, May 11, 2016 at 0910.  Pelvic/Bimanual   Ext Genitalia No lesions, no swelling and no discharge observed on external genitalia.         Vagina Vagina pink and normal texture. No lesions or discharge observed in vagina.          Cervix Cervix is present. Cervix pink and of normal texture. Cervix friable. No discharge observed.     Uterus Uterus is present and palpable. Uterus in normal position and normal size.        Adnexae Bilateral ovaries present and palpable. No tenderness on palpation.          Rectovaginal No rectal exam completed today since patient had no rectal complaints. No skin abnormalities observed on exam.    Smoking History: Patient has never smoked.  Patient Navigation: Patient education provided. Access to services provided for patient through [facility] program. Spanish interpreter provided.  Colorectal Cancer Screening: Patient has never had a colonoscopy. No complaints today.  Used Spanish interpreter [employer] from  [place].

## 2016-05-11 NOTE — Patient Instructions (Signed)
Explained breast self awareness to Janet Salazar. Let patient know BCCCP will cover Pap smears and HPV typing every 5 years unless has a history of abnormal Pap smears.Referred patient to the [facility] for a diagnostic mammogram. Appointment scheduled for Thursday, May 11, 2016 at 0910. Janet Salazar verbalized understanding.  [employer], Kathaleen Maser, RN 10:21 AM

## 2016-05-11 NOTE — Addendum Note (Signed)
Encounter addended by: Lynnell Dike, LPN on: 11/17/5186  1:32 PM<BR>    Actions taken: Order Entry activity accessed

## 2016-05-12 LAB — CYTOLOGY - PAP

## 2016-05-15 ENCOUNTER — Encounter (HOSPITAL_COMMUNITY): Payer: Self-pay | Admitting: *Deleted

## 2016-05-16 ENCOUNTER — Telehealth (HOSPITAL_COMMUNITY): Payer: Self-pay | Admitting: *Deleted

## 2016-05-16 ENCOUNTER — Telehealth: Payer: Self-pay

## 2016-05-16 NOTE — Telephone Encounter (Signed)
Janet SenegalMarly Adams interpreter called patient back to inform about WISEWOMAN Program and to see if patient was interested in participating. Patient stated was interested and was scheduled for an appointment on August 11 at 8:45 AM. Address and information about fasting and other information given concerning the appointment.

## 2016-05-16 NOTE — Telephone Encounter (Signed)
Telephoned patient at home # and discussed negative pap smear results.HPV was negative. Next pap smear due in five years. Patient voiced understanding. Used interpreter Albertina SenegalMarly Adams.

## 2016-05-19 ENCOUNTER — Other Ambulatory Visit: Payer: Self-pay

## 2016-05-19 ENCOUNTER — Ambulatory Visit: Payer: Self-pay

## 2016-05-19 VITALS — BP 136/80 | HR 50 | Temp 98.2°F | Resp 16 | Ht <= 58 in | Wt 146.8 lb

## 2016-05-19 DIAGNOSIS — Z0000 Encounter for general adult medical examination without abnormal findings: Secondary | ICD-10-CM

## 2016-05-19 NOTE — Progress Notes (Signed)
Janet Salazar is a new [facility] Salazar to the [facility] program  and is currently a BCCCP Janet Salazar effective 05/11/2016. Interpreter Janet Salazar is present with Janet Salazar.  Clinical Measurements: Janet Salazar is 4 ft. 9.48 inches, weight 146.8 lbs, and BMI 31.3.   Medical History: Janet Salazar has history of high cholesterol and told to change her diet. Janet Salazar does not have a history of hypertension or diabetes. Janet Salazar was told that A1C was elevated. Per Janet Salazar no diagnosed history of coronary heart disease, heart attack, heart failure, stroke/TIA, vascular disease or congenital heart defects. When listening to Janet Salazar's heart did hear an abnormal sound.   Blood Pressure, Self-measurement: Janet Salazar states has no reason to check Blood pressure.  Nutrition Assessment: Janet Salazar stated that eats 2 fruits every day. Janet Salazar states she eats 1 cup of vegetables a day. Per Janet Salazar does eat 3 or more ounces of whole grains daily. Janet Salazar does eat two or more servings of fish weekly. Janet Salazar states she does not drink more than 36 ounces or 450 calories of beverages with added sugars weekly. Janet Salazar stated she does watch her salt intake.   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 Janet Salazar stated has probably been doing 210 minutes of moderate exercise a week and no vigorous exercise.  Smoking Status: Janet Salazar stated that has never smoked and is not exposed to smoke.  Quality of Life Assessment: In assessing Janet Salazar's physical quality of life she stated that out of the past 30 days that she has felt her health was not good for 15 days. Janet Salazar also stated that in the past 30 days that her mental health was not good including stress, depression and problems with emotions for 5 to 6 days. Per Janet Salazar has been having problems with chest discomfort and shortness of breath. Janet Salazar did state that out of the past 30 days she felt her physical or mental health had not  kept her from doing her usual  activities including self-care, work or recreation.   Plan: Lab work will be done today including a lipid panel and Hgb A1C. Will call lab results when they are finished. Janet Salazar will receive Health Coaching for risk reduction initially and then as Janet Salazar wants.  FIRST HEALTH COACH: During initial screening with Janet Salazar interpreter present, wellness health coaching was started.  Nutrition: Discussed with Janet Salazar that normally should eat 2 servings of fruits and 2 to 21/2 of vegetables a day with a serving being 1/2 cup or small piece. Informed Janet Salazar that two servings of fish were recommended per week, being ones with omega threes. Listed the fishes with omega three's for Janet Salazar.Discussed that should have 5 to 7 ounces of fiber a day. Explained to Janet Salazar that should eat fruits and vegetables high in fiber but 3 of her 5 to 7 grains of fiber should be whole grain. Examples of whole grains were given. Informed about sugar which Janet Salazar is doing. Discussed decreasing salt intake by mainly adding some while cook and none at table.  Physical Activity:  Janet Salazar was Informed that minimal moderate activity is 150 minutes a week or 75 minutes of vigorous. Janet Salazar is doing her minimal moderate exercise. Discussed adding in some flexeibily and balance exercises.  PLAN: Will set goal when call lab results and do second health coaching. Will think about what goals may want to set.

## 2016-05-19 NOTE — Patient Instructions (Signed)
Discussed health assessment with patient. Will try get a better understanding of her healthcare problems and how to navigate system. She will be called with results of lab work and we will then discussed any further follow up the patient needs. Patient verbalized understanding.

## 2016-05-20 LAB — HEMOGLOBIN A1C
ESTIMATED AVERAGE GLUCOSE: 131 mg/dL
HEMOGLOBIN A1C: 6.2 % — AB (ref 4.8–5.6)

## 2016-05-20 LAB — LIPID PANEL
Chol/HDL Ratio: 5.9 ratio units — ABNORMAL HIGH (ref 0.0–4.4)
Cholesterol, Total: 207 mg/dL — ABNORMAL HIGH (ref 100–199)
HDL: 35 mg/dL — ABNORMAL LOW (ref >39)
LDL Calculated: 142 mg/dL — ABNORMAL HIGH (ref 0–99)
Triglycerides: 149 mg/dL (ref 0–149)
VLDL Cholesterol Cal: 30 mg/dL (ref 5–40)

## 2016-05-22 ENCOUNTER — Telehealth: Payer: Self-pay

## 2016-05-22 NOTE — Telephone Encounter (Addendum)
SECOND HEALTH COACH  LAB RESULTS: Called to inform about lab work from 05/19/16. Interpreter, Raquel informed patient: BMI 31.3, cholesterol- 207, HDL- 35, LDL- 142, triglycerides - 782149, and HBG-A1C - 6.2.    RISK REDUCTION HEALTH COACHING:  Did health coaching concerning BMI. Informed patient  that normally BMI was 18 to 25 and patient is in the obese range. Discussed portion sizes and avoiding fatty food. Informed patient needs to stay away from drinks and food that have too much sugar. Was told by interpreter that can [facility] which list omega 3's or take supplement like Fish oil or another one that pharmacist may recommend.  With LDL's informed that needed to increase exercise and fiber intake. For pre diabetic range A1C (6.2) need to decrease starch intake and sugar intake.  Interpreter informed patient that triglycerides were borderline and needs not to eat as many refined sugars and starches. Again, patient needs to increase grains/fiber intake.  When asked patient patient stated that would like to come in and learn more details about eating healthy. Appointment made for Wednesday, August 16 at 3:30PM.  TIME: 15 minutes

## 2016-05-24 ENCOUNTER — Ambulatory Visit: Payer: Self-pay

## 2016-05-24 DIAGNOSIS — Z789 Other specified health status: Secondary | ICD-10-CM

## 2016-05-24 NOTE — Progress Notes (Signed)
THIRD HEALTH COACHING Patient returns today for Health Coaching. Interpreter Delorise RoyalsJulie Sowell present. Patient coming for health coaching because of Pre -diabetic, cholesterol,  A1C, LDL, HDL and BMI 31.3 (Obese). Informed patient that I would get her a doctor's appointment due to her cholesterol and HDL levels.    HEALTH COACHING:  Patient and I went over lab results and looked at normal range.  Went through [employer] handouts about A1C, weight, BMI, carbohydrates, overweightness,cholesterol and activity.We first discussed what carbohydrates were.  Informed patient that they were mainly starches and sugar. That starches break down into sugars in the body. We then talked about portion sizes for each food group. We then looked at 1400 calorie diet and how many portions can have in each food group.Took cut out pictures of food to demonstrated how many servings could have a day of each food group.  Discussed with patient different foods and supplements that could help raise HDL's. Expressed how fiber can help in so many ways, Patient could start out doing half white and half brown rice.  Patient asked about protein powder. Discussed how to read label ans try to have less than 5 sugars in a serving. Asked patient about if took a MVI. Patient stated no. Explained to patient that may want to start taking a 50+ old MVI.   Discussed taking small steps or goals. We talked about main problems that can affect you if develop diabetes and some with pre diabetes .These are peripheral neuropathy, Eye problems, heart problems, and kidney problems. Signs and symptoms of each were explained.    Patient received handouts in Spanish and were reviewed on: A1C, BMI, Portions and serving sizes 1400 cal. Meal, Carbohydrate Counting,Tips for weight and keeping it off, Be Active Your Way, and Reading Food Labels. Patient received WISEWOMAN incentives.  WISEWOMAN PATIENT NAVIGATION: [facility] lab results of elevated cholesterol, low HDL, and pre-diabetes. Patient has doctor's appointment scheduled for 05/31/2016 at 3:30 PM concerning her Cholesterol and HDL level.  NEEDS ASSESSMENT: Patient does have barriers. These include: help understanding medical follow up needs from past, not being aware of access to services and the needed support. Patient has to work schedule around work and transportation.  PLAN of CARE; Patient will now have access to services but will need to be followed up. Will need to make sure goes through Eligibility. Will need to make sure what services are available to assist with doctor payment, medications and speciality care.   PLAN: Call patient doctor's appointment for Call for final assessment in near future.

## 2016-05-25 ENCOUNTER — Telehealth: Payer: Self-pay

## 2016-05-25 NOTE — Patient Instructions (Signed)
Will go doctor's appointment. Will make small goals and changes in portion sizes, number of portions and activity. Voiced understanding.

## 2016-05-25 NOTE — Telephone Encounter (Addendum)
Called patient to inform about doctors appointment on Wednesday, August 23 at 3:30 PM [facility]. Voice mail was reached. A message was left on voice mail and was text to patient per Delorise RoyalsJulie Sowell interpreter.

## 2016-05-31 ENCOUNTER — Encounter: Payer: Self-pay | Admitting: Family Medicine

## 2016-05-31 ENCOUNTER — Ambulatory Visit (INDEPENDENT_AMBULATORY_CARE_PROVIDER_SITE_OTHER): Payer: Self-pay | Admitting: Family Medicine

## 2016-05-31 VITALS — BP 123/75 | HR 62 | Wt 147.0 lb

## 2016-05-31 DIAGNOSIS — R7303 Prediabetes: Secondary | ICD-10-CM

## 2016-05-31 DIAGNOSIS — I10 Essential (primary) hypertension: Secondary | ICD-10-CM

## 2016-05-31 DIAGNOSIS — E785 Hyperlipidemia, unspecified: Secondary | ICD-10-CM

## 2016-05-31 DIAGNOSIS — M542 Cervicalgia: Secondary | ICD-10-CM

## 2016-05-31 MED ORDER — MELOXICAM 7.5 MG PO TABS: 7.5000 mg | ORAL_TABLET | Freq: Every day | ORAL | 0 refills | Status: DC

## 2016-05-31 NOTE — Progress Notes (Signed)
Subjective:  Janet Salazar is a 55 y.o. female who presents to the [facility] today for a WISE WOMAN visit with a chief complaint of neck pain.   HPI:  Neck Pain Pain started around 3 months ago, located at the front of her neck. Pain is intermittent in nature. She has noticed anything that makes it better or worse. No obvious precipitating events. Has not tried any medications. No fevers or chills.  Hypertension BP Readings from Last 3 Encounters:  05/31/16 123/75  05/19/16 136/80  05/11/16 140/80   Home BP monitoring-Yes Compliant with medications-N/A ROS-Denies any CP, HA, SOB, blurry vision, LE edema, transient weakness, orthopnea, PND.   Prediabetes A1c 6.2 this month. Not currently on any medications. No polyuria or polydipsia. Has been trying to eat more healthy and exercise regularly.   HLD Lipid panel this month: Lipid Panel     Component Value Date/Time   CHOL 207 (H) 05/19/2016 1003   TRIG 149 05/19/2016 1003   HDL 35 (L) 05/19/2016 1003   CHOLHDL 5.9 (H) 05/19/2016 1003   CHOLHDL 5.6 (H) 03/01/2016 1145   VLDL 37 (H) 03/01/2016 1145   LDLCALC 142 (H) 05/19/2016 1003   ROS: Per HPI, otherwise all systems reviewed and are negative  PMH:  The following were reviewed and entered/updated in epic: Past Medical History:  Diagnosis Date  . Migraines   . Thyroid disease    Patient Active Problem List   Diagnosis Date Noted  . Acute cholecystitis 12/18/2013  . Cholelithiasis 12/13/2013   Past Surgical History:  Procedure Laterality Date  . APPENDECTOMY    . CHOLECYSTECTOMY N/A 12/18/2013   Procedure: LAPAROSCOPIC CHOLECYSTECTOMY;  Surgeon: Shelly Rubensteinouglas A Blackman, MD;  Location: [facility] OR;  Service: General;  Laterality: N/A;    Family History  Problem Relation Age of Onset  . Diabetes Mother   . Hypertension Mother     Medications- reviewed and updated Current Outpatient Prescriptions  Medication Sig Dispense Refill  . cyclobenzaprine (FLEXERIL) 5 MG tablet  Take 1 tablet (5 mg total) by mouth 3 (three) times daily. (Patient not taking: Reported on 05/11/2016) 60 tablet 2  . famotidine (PEPCID) 20 MG tablet Take 1 tablet (20 mg total) by mouth 2 (two) times daily. (Patient not taking: Reported on 05/11/2016) 30 tablet 4  . FLUoxetine (PROZAC) 20 MG tablet Take 1 tablet (20 mg total) by mouth daily. (Patient not taking: Reported on 05/11/2016) 30 tablet 2  . levothyroxine (SYNTHROID, LEVOTHROID) 50 MCG tablet Take 1 tablet (50 mcg total) by mouth daily. 90 tablet 3  . meloxicam (MOBIC) 7.5 MG tablet Take 1 tablet (7.5 mg total) by mouth daily. (Patient not taking: Reported on 05/11/2016) 30 tablet 2  . predniSONE (DELTASONE) 20 MG tablet Take 2 tablets daily with breakfast. (Patient not taking: Reported on 05/11/2016) 10 tablet 0   No current facility-administered medications for this visit.     Allergies-reviewed and updated No Known Allergies  Social History   Social History  . Marital status: Widowed    Spouse name: N/A  . Number of children: N/A  . Years of education: N/A   Social History Main Topics  . Smoking status: Never Smoker  . Smokeless tobacco: Never Used  . Alcohol use No  . Drug use: No  . Sexual activity: Yes   Other Topics Concern  . None   Social History Narrative  . None    Objective:  Physical Exam: BP 123/75   Pulse 62  Wt 147 lb (66.7 kg)   LMP 10/15/2013 Comment: Patient had bleeding on July 26 that was heavy and sptting for 2 days  BMI 31.28 kg/m   Gen: NAD, resting comfortably HEENT: Bilateral SCM muscles tender to palpation and tender with rotation of neck. No masses or deformities palpated. FROM. No thyromegaly or thyroid tenderness. CV: RRR with no murmurs appreciated Pulm: NWOB, CTAB with no crackles, wheezes, or rhonchi GI: Normal bowel sounds present. Soft, Nontender, Nondistended. MSK: no edema, cyanosis, or clubbing noted Skin: warm, dry Neuro: grossly normal, moves all extremities Psych: Normal  affect and thought content  Assessment/Plan:  Neck Pain Seems to be most consistent with SCM strain given tenderness on palpation and pain with active rotation of head. Will prescribe 2 week course of meloxicam. Advised rest over the next 1-2 weeks with gentle stretching exercises as tolerated.  HTN Well controlled today. No indication for pharmacologic intervention. Continue lifestyle modifications.  Prediabetes Patient deferred starting metformin today. Will continue lifestyle modification.  HLD 10 year ascvd risk of 2.7%. Not in benefit group for statin. Will continue lifestyle modification.   Katina Degreealeb M. Jimmey RalphParker, MD [facility] Resident PGY-3 05/31/2016 3:52 PM

## 2016-05-31 NOTE — Patient Instructions (Signed)
We will send in a medication for your neck. I do not think that it is due to your thyroid. You probably have a muscle strain.  Please continue to work on your diet and exercise.  Take care,  Dr Jimmey RalphParker

## 2017-01-30 ENCOUNTER — Other Ambulatory Visit: Payer: Self-pay | Admitting: Obstetrics and Gynecology

## 2017-01-30 DIAGNOSIS — N644 Mastodynia: Secondary | ICD-10-CM

## 2017-01-31 ENCOUNTER — Encounter: Payer: Self-pay | Admitting: Family Medicine

## 2017-01-31 ENCOUNTER — Ambulatory Visit: Payer: Self-pay | Admitting: Family Medicine

## 2017-01-31 ENCOUNTER — Ambulatory Visit (INDEPENDENT_AMBULATORY_CARE_PROVIDER_SITE_OTHER): Payer: Self-pay | Admitting: Family Medicine

## 2017-01-31 VITALS — BP 106/60 | HR 59 | Temp 98.7°F | Ht <= 58 in | Wt 145.0 lb

## 2017-01-31 DIAGNOSIS — J029 Acute pharyngitis, unspecified: Secondary | ICD-10-CM

## 2017-01-31 MED ORDER — FLUTICASONE PROPIONATE 50 MCG/ACT NA SUSP: 2.0000 | Freq: Every day | NASAL | 6 refills | Status: AC

## 2017-01-31 MED ORDER — OMEPRAZOLE 20 MG PO CPDR: 20.0000 mg | DELAYED_RELEASE_CAPSULE | Freq: Every day | ORAL | 3 refills | Status: AC

## 2017-01-31 NOTE — Patient Instructions (Signed)
Start the flonase and omeprazole.  Let us know if your symptoms do not improve.  Take care,  Dr Jimmey Ralph

## 2017-01-31 NOTE — Progress Notes (Signed)
    Subjective:  Janet Salazar is a 56 y.o. female who presents to the [facility] today with a chief complaint of sore throat.   HPI:  Sore Throat Symptoms started about 3 weeks ago with a cough. The cough resolved but the sore throat has persisted. Some fevers. No rhinorrhea. Symptoms persist throughout the day. No reflux symptoms. Thinks that she clears her throat a lot. Also associated with some hoarseness. Took tylenol which did not help very much. No sick contacts.   ROS: Per HPI  Objective:  Physical Exam: BP 106/60   Pulse (!) 59   Temp 98.7 F (37.1 C) (Oral)   Ht  (1.448 m)   Wt 145 lb (65.8 kg)   LMP 10/15/2013 Comment: Patient had bleeding on July 26 that was heavy and sptting for 2 days  SpO2 98%   BMI 31.38 kg/m   Gen: NAD, resting comfortably HEENT: TMs normal. OP clear without any lesions. Nasal mucosa normal. No LAD.  CV: RRR with no murmurs appreciated Pulm: NWOB, CTAB with no crackles, wheezes, or rhonchi MSK: no edema, cyanosis, or clubbing noted Skin: warm, dry Neuro: grossly normal, moves all extremities Psych: Normal affect and thought content  Assessment/Plan:  Sore Throat Seems to be mostly post-nasal drip or possibly post-viral syndrome. No red flag signs or symptoms. May have a component of underlying GERD. Will treat with flonase and omeprazole. Return precautions reviewed. Return in 1-2 weeks if not improving.   Katina Degree. Jimmey Ralph, MD [facility] Resident PGY-3 01/31/2017 3:37 PM

## 2017-02-05 ENCOUNTER — Ambulatory Visit
Admission: RE | Admit: 2017-02-05 | Discharge: 2017-02-05 | Disposition: A | Discharge status: Home / Self Care | Payer: No Typology Code available for payment source | Source: Ambulatory Visit | Attending: Obstetrics and Gynecology | Admitting: Obstetrics and Gynecology

## 2017-02-05 ENCOUNTER — Other Ambulatory Visit: Payer: Self-pay | Admitting: Obstetrics and Gynecology

## 2017-02-05 DIAGNOSIS — N644 Mastodynia: Secondary | ICD-10-CM

## 2017-02-12 ENCOUNTER — Ambulatory Visit (INDEPENDENT_AMBULATORY_CARE_PROVIDER_SITE_OTHER): Payer: Self-pay | Admitting: Family Medicine

## 2017-02-12 ENCOUNTER — Ambulatory Visit (HOSPITAL_COMMUNITY)
Admission: RE | Admit: 2017-02-12 | Discharge: 2017-02-12 | Disposition: A | Discharge status: Home / Self Care | Payer: Self-pay | Source: Ambulatory Visit | Attending: Family Medicine | Admitting: Family Medicine

## 2017-02-12 ENCOUNTER — Encounter: Payer: Self-pay | Admitting: Family Medicine

## 2017-02-12 VITALS — BP 110/60 | HR 53 | Temp 97.8°F | Ht <= 58 in | Wt 144.8 lb

## 2017-02-12 DIAGNOSIS — R001 Bradycardia, unspecified: Secondary | ICD-10-CM | POA: Insufficient documentation

## 2017-02-12 DIAGNOSIS — R9431 Abnormal electrocardiogram [ECG] [EKG]: Secondary | ICD-10-CM | POA: Insufficient documentation

## 2017-02-12 DIAGNOSIS — R7303 Prediabetes: Secondary | ICD-10-CM | POA: Insufficient documentation

## 2017-02-12 DIAGNOSIS — R7309 Other abnormal glucose: Secondary | ICD-10-CM

## 2017-02-12 DIAGNOSIS — Z0000 Encounter for general adult medical examination without abnormal findings: Secondary | ICD-10-CM

## 2017-02-12 DIAGNOSIS — E039 Hypothyroidism, unspecified: Secondary | ICD-10-CM | POA: Insufficient documentation

## 2017-02-12 DIAGNOSIS — E785 Hyperlipidemia, unspecified: Secondary | ICD-10-CM

## 2017-02-12 DIAGNOSIS — R079 Chest pain, unspecified: Secondary | ICD-10-CM

## 2017-02-12 LAB — POCT GLYCOSYLATED HEMOGLOBIN (HGB A1C): HEMOGLOBIN A1C: 6.1

## 2017-02-12 MED ORDER — ASPIRIN EC 81 MG PO TBEC: 81.0000 mg | DELAYED_RELEASE_TABLET | Freq: Every day | ORAL | 11 refills | Status: AC

## 2017-02-12 MED ORDER — NITROGLYCERIN 0.4 MG SL SUBL
0.4000 mg | SUBLINGUAL_TABLET | SUBLINGUAL | 3 refills | Status: AC | PRN
Start: 2017-02-12 — End: ?

## 2017-02-12 NOTE — Assessment & Plan Note (Signed)
Due for HCV and colon cancer screening. Will defer for today until patient gets financial assistance.

## 2017-02-12 NOTE — Assessment & Plan Note (Signed)
Continue synthroid. Check TSH today.  

## 2017-02-12 NOTE — Assessment & Plan Note (Signed)
Not currently on any medications. Will check lipid panel today.

## 2017-02-12 NOTE — Patient Instructions (Signed)
We will check blood work today.  Your blood sugar is good.  Please start the baby aspirin.  Use nitroglycerin if you have chest pain.  If you have chest pain that does nto resolve after rest or nitroglycerin, please seek medical care.  We will set you up to have a stress test.  Take care,  Dr Jimmey RalphParker

## 2017-02-12 NOTE — Assessment & Plan Note (Signed)
A1c stable at 6.1 today. Not symptomatic. Deferred starting metformin at this time. Follow up in 6-12 months.

## 2017-02-12 NOTE — Progress Notes (Signed)
    Subjective:  Janet Salazar is a 56 y.o. female who [facility] with a chief complaint of prediabetes. Marland Kitchen.   HPI:  Prediabetes Not currently on any medications. Currently managed with lifestyle modifications. Weight down about 3 pounds since last visit. No polyuria or polydipsia.   HLD Not currently on any medications. Managing with lifestyle modifications.  Hypothyroidism Tolerating synthroid 50mcg daily without side effects. No diarrhea, constipation, palpitations, or skin changes.   Chest Pain Present all the time. Substernal pain. Feels like a chest pressure. Worse with exertion. Associated with shortness of breath. Better with rest. No current chest pain.  Healthcare Maintenance Due for HCV and colonoscopy.   ROS: Per HPI  PMH: Smoking history reviewed.   Objective:  Physical Exam: BP 110/60 (BP Location: Left Arm, Patient Position: Sitting, Cuff Size: Normal)   Pulse (!) 53   Temp 97.8 F (36.6 C) (Oral)   Ht 4\' 9"  (1.448 m)   Wt 144 lb 12.8 oz (65.7 kg)   LMP 10/15/2013 Comment: Patient had bleeding on July 26 that was heavy and sptting for 2 days  SpO2 98%   BMI 31.33 kg/m   Gen: NAD, resting comfortably CV: Regular. Bradycardic. with no murmurs appreciated Pulm: NWOB, CTAB with no crackles, wheezes, or rhonchi GI: Normal bowel sounds present. Soft, Nontender, Nondistended. MSK: no edema, cyanosis, or clubbing noted -Chest: Tender to palpation along right sternal border.  Skin: warm, dry Neuro: grossly normal, moves all extremities Psych: Normal affect and thought content  Results for orders placed or performed in visit on 02/12/17 (from the past 72 hour(s))  HgB A1c     Status: None   Collection Time: 02/12/17  9:40 AM  Result Value Ref Range   Hemoglobin A1C 6.1    EKG: Sinus bradycardia.. No acute ischemic changes.   Assessment/Plan:  Prediabetes A1c stable at 6.1 today. Not symptomatic. Deferred starting metformin at this time.  Follow up in 6-12 months.   HLD (hyperlipidemia) Not currently on any medications. Will check lipid panel today.   Hypothyroidism Continue synthroid. Check TSH today.   Healthcare maintenance Due for HCV and colon cancer screening. Will defer for today until patient gets financial assistance.   Chest Pain Symptoms are concerning for stable angina, though she does have some reproducibility on exam. Given her concerning history, as well as a history of prediabetes and HLD in addition to her age, it reasonable at this point to check a stress test. This was ordered today. Advised patient to start [facility]. Will also start ASA 81mg  daily and SL nitro as needed. Strict return precautions reviewed. Consider referral to cardiology pending results of stress testing.   Katina Degreealeb M. Jimmey RalphParker, MD [facility] Resident PGY-3 02/12/2017 10:32 AM

## 2017-02-13 LAB — TSH: TSH: 5.16 u[IU]/mL — AB (ref 0.450–4.500)

## 2017-02-13 LAB — LIPID PANEL
CHOLESTEROL TOTAL: 221 mg/dL — AB (ref 100–199)
Chol/HDL Ratio: 6.5 ratio — ABNORMAL HIGH (ref 0.0–4.4)
HDL: 34 mg/dL — ABNORMAL LOW (ref >39)
LDL Calculated: 142 mg/dL — ABNORMAL HIGH (ref 0–99)
Triglycerides: 224 mg/dL — ABNORMAL HIGH (ref 0–149)
VLDL Cholesterol Cal: 45 mg/dL — ABNORMAL HIGH (ref 5–40)

## 2017-02-22 ENCOUNTER — Encounter: Payer: Self-pay | Admitting: Family Medicine

## 2017-02-22 ENCOUNTER — Telehealth: Payer: Self-pay | Admitting: Family Medicine

## 2017-02-22 NOTE — Telephone Encounter (Signed)
Called patient regarding lab results via spanish interpretor. Interpretor attempted to call twice and stated that the person picked up and said that she had the wrong number. Will send letter with results - patient would benefit from starting statin.  Katina Degreealeb M. Jimmey RalphParker, MD [facility] Resident PGY-3 02/22/2017 12:03 PM

## 2017-07-25 ENCOUNTER — Encounter (HOSPITAL_COMMUNITY): Payer: Self-pay

## 2019-07-04 ENCOUNTER — Emergency Department (HOSPITAL_COMMUNITY)
Admission: EM | Admit: 2019-07-04 | Discharge: 2019-07-04 | Disposition: A | Discharge status: Home / Self Care | Payer: No Typology Code available for payment source | Attending: Emergency Medicine | Admitting: Emergency Medicine

## 2019-07-04 ENCOUNTER — Other Ambulatory Visit: Payer: Self-pay

## 2019-07-04 ENCOUNTER — Emergency Department (HOSPITAL_COMMUNITY): Payer: No Typology Code available for payment source

## 2019-07-04 ENCOUNTER — Encounter (HOSPITAL_COMMUNITY): Payer: Self-pay | Admitting: Emergency Medicine

## 2019-07-04 DIAGNOSIS — Z79899 Other long term (current) drug therapy: Secondary | ICD-10-CM | POA: Diagnosis not present

## 2019-07-04 DIAGNOSIS — Z7982 Long term (current) use of aspirin: Secondary | ICD-10-CM | POA: Diagnosis not present

## 2019-07-04 DIAGNOSIS — E039 Hypothyroidism, unspecified: Secondary | ICD-10-CM | POA: Diagnosis not present

## 2019-07-04 DIAGNOSIS — R51 Headache: Secondary | ICD-10-CM | POA: Insufficient documentation

## 2019-07-04 DIAGNOSIS — R079 Chest pain, unspecified: Secondary | ICD-10-CM | POA: Insufficient documentation

## 2019-07-04 DIAGNOSIS — M791 Myalgia, unspecified site: Secondary | ICD-10-CM | POA: Diagnosis not present

## 2019-07-04 MED ORDER — METHOCARBAMOL 500 MG PO TABS: 500.0000 mg | ORAL_TABLET | Freq: Two times a day (BID) | ORAL | 0 refills | Status: AC | PRN

## 2019-07-04 MED ORDER — ACETAMINOPHEN 500 MG PO TABS
1000.0000 mg | ORAL_TABLET | Freq: Once | ORAL | Status: AC
  Administered 2019-07-04: 1000 mg via ORAL
  Filled 2019-07-04: qty 2

## 2019-07-04 MED ORDER — OXYCODONE HCL 5 MG PO TABS
5.0000 mg | ORAL_TABLET | Freq: Once | ORAL | Status: AC
  Administered 2019-07-04: 5 mg via ORAL
  Filled 2019-07-04: qty 1

## 2019-07-04 MED ORDER — NAPROXEN 500 MG PO TABS: 500.0000 mg | ORAL_TABLET | Freq: Two times a day (BID) | ORAL | 0 refills | Status: AC | PRN
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
